# Patient Record
Sex: Female | Born: 1970 | Race: White | Hispanic: No | Marital: Married | Smoking: Current every day smoker
Health system: Southern US, Community
[De-identification: ages and names within clinical notes are randomized; demographics above are authoritative.]

## PROBLEM LIST (undated history)

## (undated) DIAGNOSIS — IMO0002 Reserved for concepts with insufficient information to code with codable children: Secondary | ICD-10-CM

## (undated) DIAGNOSIS — K219 Gastro-esophageal reflux disease without esophagitis: Secondary | ICD-10-CM

## (undated) DIAGNOSIS — M329 Systemic lupus erythematosus, unspecified: Secondary | ICD-10-CM

## (undated) DIAGNOSIS — R928 Other abnormal and inconclusive findings on diagnostic imaging of breast: Secondary | ICD-10-CM

## (undated) HISTORY — PX: TONSILLECTOMY: SUR1361

## (undated) HISTORY — DX: Reserved for concepts with insufficient information to code with codable children: IMO0002

## (undated) HISTORY — DX: Gastro-esophageal reflux disease without esophagitis: K21.9

## (undated) HISTORY — DX: Systemic lupus erythematosus, unspecified: M32.9

## (undated) HISTORY — PX: COLONOSCOPY: SHX174

## (undated) HISTORY — DX: Other abnormal and inconclusive findings on diagnostic imaging of breast: R92.8

---
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
## 2004-07-06 ENCOUNTER — Emergency Department: Payer: Self-pay | Admitting: Internal Medicine

## 2011-09-06 DIAGNOSIS — R928 Other abnormal and inconclusive findings on diagnostic imaging of breast: Secondary | ICD-10-CM

## 2011-09-06 HISTORY — DX: Other abnormal and inconclusive findings on diagnostic imaging of breast: R92.8

## 2011-09-06 HISTORY — PX: BREAST SURGERY: SHX581

## 2011-10-04 ENCOUNTER — Ambulatory Visit: Payer: Self-pay | Admitting: Family Medicine

## 2011-10-07 ENCOUNTER — Ambulatory Visit: Payer: Self-pay | Admitting: Family Medicine

## 2011-10-19 ENCOUNTER — Ambulatory Visit: Payer: Self-pay | Admitting: General Surgery

## 2012-05-06 HISTORY — PX: BREAST BIOPSY: SHX20

## 2012-05-09 ENCOUNTER — Ambulatory Visit: Payer: Self-pay | Admitting: General Surgery

## 2012-06-04 ENCOUNTER — Ambulatory Visit: Payer: Self-pay | Admitting: General Surgery

## 2012-10-08 DIAGNOSIS — B002 Herpesviral gingivostomatitis and pharyngotonsillitis: Secondary | ICD-10-CM | POA: Insufficient documentation

## 2012-10-08 DIAGNOSIS — K219 Gastro-esophageal reflux disease without esophagitis: Secondary | ICD-10-CM | POA: Insufficient documentation

## 2012-10-08 DIAGNOSIS — Z720 Tobacco use: Secondary | ICD-10-CM | POA: Insufficient documentation

## 2012-10-14 ENCOUNTER — Encounter: Payer: Self-pay | Admitting: *Deleted

## 2012-10-14 DIAGNOSIS — R928 Other abnormal and inconclusive findings on diagnostic imaging of breast: Secondary | ICD-10-CM | POA: Insufficient documentation

## 2012-11-28 ENCOUNTER — Ambulatory Visit: Payer: Self-pay | Admitting: General Surgery

## 2012-12-20 ENCOUNTER — Ambulatory Visit: Payer: Self-pay | Admitting: General Surgery

## 2012-12-24 ENCOUNTER — Encounter: Payer: Self-pay | Admitting: General Surgery

## 2012-12-24 ENCOUNTER — Ambulatory Visit (INDEPENDENT_AMBULATORY_CARE_PROVIDER_SITE_OTHER): Payer: 59 | Admitting: General Surgery

## 2012-12-24 VITALS — BP 118/84 | HR 78 | Resp 12 | Ht 62.0 in | Wt 224.0 lb

## 2012-12-24 DIAGNOSIS — N63 Unspecified lump in unspecified breast: Secondary | ICD-10-CM

## 2012-12-24 NOTE — Patient Instructions (Addendum)
Return as needed

## 2012-12-24 NOTE — Progress Notes (Signed)
Patient ID: Pamela Mccormick, female   DOB: May 11, 1971, 42 y.o.   MRN: [id number]  Chief Complaint  Patient presents with  . Follow-up    mammogram    HPI Pamela Mccormick is a 42 y.o. female here todday for a n follow up mammogram done at [facility] on 12/20/12 cat 2 . Patient states no new breast problems.Patieny had a left  breast core biopsy back September 2013.this showed flat epithelial atypia with microcalcifications, sclerosing adenosis and columnar cell changes. No evidence of in situ or invasive cancer. HPI  Past Medical History  Diagnosis Date  . Abnormal mammogram, unspecified 2013    LEFT BREAST  . Acid reflux     Past Surgical History  Procedure Laterality Date  . Tonsillectomy  AGE 5  . Colonoscopy  AGE 14  . Skull fx   1984    FRACTURE  . Breast surgery Left 2013    left breast core biopsy    Family History  Problem Relation Age of Onset  . Lymphoma Mother 73    Social History History  Substance Use Topics  . Smoking status: Current Every Day Smoker -- 1.00 packs/day for 20 years  . Smokeless tobacco: Never Used  . Alcohol Use: Yes    No Known Allergies  Current Outpatient Prescriptions  Medication Sig Dispense Refill  . ranitidine (ZANTAC) 150 MG tablet Take 150 mg by mouth 2 (two) times daily.       No current facility-administered medications for this visit.    Review of Systems Review of Systems  Constitutional: Negative.   Respiratory: Negative.   Cardiovascular: Negative.     Blood pressure 118/84, pulse 78, resp. rate 12, height 5\' 2"  (1.575 m), weight 224 lb (101.606 kg), last menstrual period 12/17/2012.  Physical Exam Physical Exam  Constitutional: She appears well-developed and well-nourished.  Neck: Normal range of motion. Neck supple.  Cardiovascular: Normal rate, regular rhythm and normal heart sounds.   Pulmonary/Chest: Effort normal and breath sounds normal. Right breast exhibits no inverted nipple, no mass, no nipple discharge, no skin  change and no tenderness. Left breast exhibits no inverted nipple, no mass, no nipple discharge, no skin change and no tenderness.  Lymphadenopathy:    She has no cervical adenopathy.    She has no axillary adenopathy.    Data Reviewed Mammograms dated December 20, 2012 were reviewed. The previously noted abnormality is resolved. BI-RAD-2.  Assessment    Benign breast exam.     Plan    The patient should resume annual bilateral mammograms in fall 2014 under the care of her primary care physician.        Earline Mayotte 12/25/2012, 8:39 PM  CC: Cay Schillings, M.D.

## 2012-12-25 ENCOUNTER — Encounter: Payer: Self-pay | Admitting: General Surgery

## 2014-07-07 ENCOUNTER — Encounter: Payer: Self-pay | Admitting: General Surgery

## 2015-06-20 ENCOUNTER — Emergency Department: Payer: 59

## 2015-06-20 ENCOUNTER — Observation Stay
Admission: EM | Admit: 2015-06-20 | Discharge: 2015-06-21 | Disposition: A | Discharge status: Home / Self Care | Payer: 59 | Attending: Internal Medicine | Admitting: Internal Medicine

## 2015-06-20 DIAGNOSIS — M25559 Pain in unspecified hip: Secondary | ICD-10-CM | POA: Diagnosis present

## 2015-06-20 DIAGNOSIS — Z8249 Family history of ischemic heart disease and other diseases of the circulatory system: Secondary | ICD-10-CM | POA: Diagnosis not present

## 2015-06-20 DIAGNOSIS — R102 Pelvic and perineal pain: Secondary | ICD-10-CM | POA: Insufficient documentation

## 2015-06-20 DIAGNOSIS — K5730 Diverticulosis of large intestine without perforation or abscess without bleeding: Secondary | ICD-10-CM | POA: Diagnosis not present

## 2015-06-20 DIAGNOSIS — Z833 Family history of diabetes mellitus: Secondary | ICD-10-CM | POA: Diagnosis not present

## 2015-06-20 DIAGNOSIS — K219 Gastro-esophageal reflux disease without esophagitis: Secondary | ICD-10-CM | POA: Insufficient documentation

## 2015-06-20 DIAGNOSIS — R928 Other abnormal and inconclusive findings on diagnostic imaging of breast: Secondary | ICD-10-CM | POA: Diagnosis not present

## 2015-06-20 DIAGNOSIS — S7002XA Contusion of left hip, initial encounter: Principal | ICD-10-CM | POA: Insufficient documentation

## 2015-06-20 DIAGNOSIS — W11XXXA Fall on and from ladder, initial encounter: Secondary | ICD-10-CM | POA: Insufficient documentation

## 2015-06-20 DIAGNOSIS — Y939 Activity, unspecified: Secondary | ICD-10-CM | POA: Diagnosis not present

## 2015-06-20 DIAGNOSIS — M62838 Other muscle spasm: Secondary | ICD-10-CM | POA: Diagnosis not present

## 2015-06-20 DIAGNOSIS — M160 Bilateral primary osteoarthritis of hip: Secondary | ICD-10-CM | POA: Diagnosis not present

## 2015-06-20 DIAGNOSIS — Y929 Unspecified place or not applicable: Secondary | ICD-10-CM | POA: Diagnosis not present

## 2015-06-20 DIAGNOSIS — Z807 Family history of other malignant neoplasms of lymphoid, hematopoietic and related tissues: Secondary | ICD-10-CM | POA: Insufficient documentation

## 2015-06-20 DIAGNOSIS — F17200 Nicotine dependence, unspecified, uncomplicated: Secondary | ICD-10-CM | POA: Insufficient documentation

## 2015-06-20 DIAGNOSIS — W19XXXA Unspecified fall, initial encounter: Secondary | ICD-10-CM

## 2015-06-20 DIAGNOSIS — M25552 Pain in left hip: Secondary | ICD-10-CM | POA: Diagnosis present

## 2015-06-20 LAB — CBC
HCT: 38.3 % (ref 35.0–47.0)
HEMOGLOBIN: 13.2 g/dL (ref 12.0–16.0)
MCH: 31.9 pg (ref 26.0–34.0)
MCHC: 34.5 g/dL (ref 32.0–36.0)
MCV: 92.4 fL (ref 80.0–100.0)
PLATELETS: 211 10*3/uL (ref 150–440)
RBC: 4.15 MIL/uL (ref 3.80–5.20)
RDW: 13.1 % (ref 11.5–14.5)
WBC: 10.5 10*3/uL (ref 3.6–11.0)

## 2015-06-20 LAB — CREATININE, SERUM
CREATININE: 0.84 mg/dL (ref 0.44–1.00)
GFR calc Af Amer: >60 mL/min (ref >60)

## 2015-06-20 MED ORDER — HYDROCODONE-ACETAMINOPHEN 5-325 MG PO TABS
1.0000 | ORAL_TABLET | ORAL | Status: DC | PRN
  Administered 2015-06-20 – 2015-06-21 (×2): 2 via ORAL
  Filled 2015-06-20: qty 2

## 2015-06-20 MED ORDER — CYCLOBENZAPRINE HCL 10 MG PO TABS
5.0000 mg | ORAL_TABLET | Freq: Three times a day (TID) | ORAL | Status: DC | PRN
  Administered 2015-06-20 – 2015-06-21 (×2): 5 mg via ORAL
  Filled 2015-06-20 (×2): qty 1

## 2015-06-20 MED ORDER — ONDANSETRON HCL 4 MG/2ML IJ SOLN
4.0000 mg | Freq: Once | INTRAMUSCULAR | Status: AC
  Administered 2015-06-20: 4 mg via INTRAVENOUS

## 2015-06-20 MED ORDER — ENOXAPARIN SODIUM 40 MG/0.4ML ~~LOC~~ SOLN
40.0000 mg | SUBCUTANEOUS | Status: DC
  Administered 2015-06-20: 40 mg via SUBCUTANEOUS
  Filled 2015-06-20: qty 0.4

## 2015-06-20 MED ORDER — HYDROMORPHONE HCL 1 MG/ML IJ SOLN
1.0000 mg | INTRAMUSCULAR | Status: DC | PRN
  Administered 2015-06-20: 1 mg via INTRAVENOUS

## 2015-06-20 MED ORDER — PANTOPRAZOLE SODIUM 40 MG PO TBEC
40.0000 mg | DELAYED_RELEASE_TABLET | Freq: Every day | ORAL | Status: DC
  Administered 2015-06-20 – 2015-06-21 (×2): 40 mg via ORAL
  Filled 2015-06-20 (×2): qty 1

## 2015-06-20 MED ORDER — HYDROMORPHONE HCL 1 MG/ML IJ SOLN
INTRAMUSCULAR | Status: AC
  Administered 2015-06-20: 1 mg via INTRAVENOUS
  Filled 2015-06-20: qty 1

## 2015-06-20 MED ORDER — ONDANSETRON HCL 4 MG/2ML IJ SOLN
INTRAMUSCULAR | Status: AC
  Administered 2015-06-20: 4 mg via INTRAVENOUS
  Filled 2015-06-20: qty 2

## 2015-06-20 MED ORDER — ACETAMINOPHEN 325 MG PO TABS: 650.0000 mg | ORAL_TABLET | Freq: Four times a day (QID) | ORAL | Status: DC | PRN

## 2015-06-20 MED ORDER — HYDROMORPHONE HCL 1 MG/ML IJ SOLN
1.0000 mg | Freq: Once | INTRAMUSCULAR | Status: AC
  Administered 2015-06-20: 1 mg via INTRAVENOUS

## 2015-06-20 MED ORDER — ACETAMINOPHEN 650 MG RE SUPP: 650.0000 mg | Freq: Four times a day (QID) | RECTAL | Status: DC | PRN

## 2015-06-20 MED ORDER — ONDANSETRON HCL 4 MG/2ML IJ SOLN: 4.0000 mg | Freq: Four times a day (QID) | INTRAMUSCULAR | Status: DC | PRN

## 2015-06-20 MED ORDER — ONDANSETRON HCL 4 MG PO TABS
4.0000 mg | ORAL_TABLET | Freq: Four times a day (QID) | ORAL | Status: DC | PRN
  Administered 2015-06-20 – 2015-06-21 (×2): 4 mg via ORAL
  Filled 2015-06-20 (×2): qty 1

## 2015-06-20 NOTE — H&P (Signed)
[facility] [place] [facility]   PATIENT NAME: Pamela Mccormick    MR#:  [id number]  DATE OF BIRTH:  01/10/1971  DATE OF ADMISSION:  06/20/2015  PRIMARY CARE PHYSICIAN: Andree Elk, MD   REQUESTING/REFERRING PHYSICIAN: Dr. Janalyn Harder  CHIEF COMPLAINT:   Chief Complaint  Patient presents with  . Fall  . Hip Pain    HISTORY OF PRESENT ILLNESS:  Pamela Mccormick  is a 44 y.o. female with a known history of GERD who presents to the hospital after suffering a mechanical fall off a ladder today. Patient was helping her husband and was on a ladder and then fell to the ground and hit a Financial planner for a fifth wheel. She had significant pain to her left hip and posterior back area. Patient could not bear any weight on her legs in a difficult time standing and therefore was brought via EMS to the emergency room. Patient underwent CT scan of her hip and also x-ray which showed no evidence of acute fracture. Patient received some IV fentanyl and IV Dilaudid for pain control but despite that she has significant pain in her left hip area and therefore hospitalist services were contacted for pain management.  PAST MEDICAL HISTORY:   Past Medical History  Diagnosis Date  . Abnormal mammogram, unspecified 2013    LEFT BREAST  . Acid reflux     PAST SURGICAL HISTORY:   Past Surgical History  Procedure Laterality Date  . Tonsillectomy  AGE 52  . Colonoscopy  AGE 48  . Skull fx   1984    FRACTURE  . Breast surgery Left 2013    left breast core biopsy    SOCIAL HISTORY:   Social History  Substance Use Topics  . Smoking status: Current Every Day Smoker -- 1.00 packs/day for 20 years  . Smokeless tobacco: Never Used  . Alcohol Use: Yes    FAMILY HISTORY:   Family History  Problem Relation Age of Onset  . Lymphoma Mother 35  . Diabetes Father   . CAD Father     DRUG ALLERGIES:  No Known Allergies  REVIEW OF SYSTEMS:   Review of Systems   Constitutional: Negative for fever and weight loss.  HENT: Negative for congestion, nosebleeds and tinnitus.   Eyes: Negative for blurred vision, double vision and redness.  Respiratory: Negative for cough, hemoptysis and shortness of breath.   Cardiovascular: Negative for chest pain, orthopnea, leg swelling and PND.  Gastrointestinal: Negative for nausea, vomiting, abdominal pain, diarrhea and melena.  Genitourinary: Negative for dysuria, urgency and hematuria.  Musculoskeletal: Positive for back pain (left posterior back), joint pain (left hip) and falls.  Neurological: Negative for dizziness, tingling, sensory change, focal weakness, seizures, weakness and headaches.  Endo/Heme/Allergies: Negative for polydipsia. Does not bruise/bleed easily.  Psychiatric/Behavioral: Negative for depression and memory loss. The patient is not nervous/anxious.     MEDICATIONS AT HOME:   Prior to Admission medications   Medication Sig Start Date End Date Taking? Authorizing Provider  omeprazole (PRILOSEC) 20 MG capsule Take 20 mg by mouth daily.   Yes Historical Provider, MD      VITAL SIGNS:  Blood pressure 126/56, pulse 78, temperature 98.1 F (36.7 C), temperature source Oral, resp. rate 18, height  (1.6 m), weight 95.255 kg (210 lb), last menstrual period 05/07/2015, SpO2 100 %.  PHYSICAL EXAMINATION:  Physical Exam  GENERAL:  44 y.o.-year-old patient lying in the bed with no acute distress.  EYES: Pupils equal, round, reactive to light and accommodation. No scleral icterus. Extraocular muscles intact.  HEENT: Head atraumatic, normocephalic. Oropharynx and nasopharynx clear. No oropharyngeal erythema, moist oral mucosa  NECK:  Supple, no jugular venous distention. No thyroid enlargement, no tenderness.  LUNGS: Normal breath sounds bilaterally, no wheezing, rales, rhonchi. No use of accessory muscles of respiration.  CARDIOVASCULAR: S1, S2 RRR. No murmurs, rubs, gallops, clicks.  ABDOMEN:  Soft, nontender, nondistended. Bowel sounds present. No organomegaly or mass.  EXTREMITIES: No pedal edema, cyanosis, or clubbing. + 2 pedal & radial pulses b/l.   NEUROLOGIC: Cranial nerves II through XII are intact. No focal Motor or sensory deficits appreciated b/l PSYCHIATRIC: The patient is alert and oriented x 3. Good affect.  SKIN: No obvious rash, lesion, or ulcer.   LABORATORY PANEL:   CBC No results for input(s): WBC, HGB, HCT, PLT in the last 168 hours. ------------------------------------------------------------------------------------------------------------------  Chemistries  No results for input(s): NA, K, CL, CO2, GLUCOSE, BUN, CREATININE, CALCIUM, MG, AST, ALT, ALKPHOS, BILITOT in the last 168 hours.  Invalid input(s): GFRCGP ------------------------------------------------------------------------------------------------------------------  Cardiac Enzymes No results for input(s): TROPONINI in the last 168 hours. ------------------------------------------------------------------------------------------------------------------  RADIOLOGY:  Dg Pelvis 1-2 Views  06/20/2015  CLINICAL DATA:  Fall from step ladder EXAM: PELVIS - 1-2 VIEW COMPARISON:  None. FINDINGS: Single frontal view of the pelvis submitted. No displaced fracture or subluxation. Mild superior acetabular spurring bilaterally. Bilateral hip joints are symmetrical in appearance. Pelvic phleboliths are noted. IMPRESSION: No acute fracture or subluxation. Mild degenerative changes with superior acetabular spurring bilaterally. Electronically Signed   By: Natasha Mead M.D.   On: 06/20/2015 17:55   Ct Pelvis Wo Contrast  06/20/2015  CLINICAL DATA:  Fall off ladder onto Financial planner. Severe left hip and pelvic pain. Initial encounter. EXAM: CT PELVIS WITHOUT CONTRAST TECHNIQUE: Multidetector CT imaging of the pelvis was performed following the standard protocol without intravenous contrast. COMPARISON:  None.  FINDINGS: No evidence of acute fracture or dislocation involving either hip. No evidence of pelvic fracture or pelvic joint diastases. Mild degenerative spurring of both hips seen without significant joint space narrowing. Probable small bone island seen in the right femoral head. No evidence pelvic soft tissue hematoma or free fluid. Sigmoid diverticulosis noted as well as small approximately 2 cm left adnexal cyst. IMPRESSION: No acute findings.  No evidence of pelvic or hip fracture. Electronically Signed   By: Myles Rosenthal M.D.   On: 06/20/2015 18:00     IMPRESSION AND PLAN:   44 year old female with past medical history of GERD who presented to the hospital after a fall from a ladder and noted to have significant left hip pain and posterior back pain.  #1 left hip/lower back pain-likely secondary to a contusion from her fall. -Patient has no findings of acute fracture in the left hip or pelvis area. -She is being admitted for pain control. I will start her on some IV Dilaudid as needed along with oral Percocet. -I will also start her on Flexeril for muscle spasms.   -We'll get orthopedic evaluation in the morning.  #2 GERD-continue Protonix.     All the records are reviewed and case discussed with ED provider. Management plans discussed with the patient, family and they are in agreement.  CODE STATUS: Full  TOTAL TIME TAKING CARE OF THIS PATIENT: 45 minutes.    Houston Siren M.D on 06/20/2015 at 8:07 PM  Between 7am to 6pm - Pager - [phone]  After 6pm go to  [email or web address] - password [email or web address] [email or web address]  [facility]  Office  [phone]  CC: Primary care physician; Andree ElkWOLF,JACK H, MD

## 2015-06-20 NOTE — ED Provider Notes (Signed)
[facility] Emergency Department Provider Note  ____________________________________________  Time seen: Approximately 5:20 PM  I have reviewed the triage vital signs and the nursing notes.   HISTORY  Chief Complaint Fall and Hip Pain    HPI Pamela Mccormick is a 44 y.o. female who presents to the emergency department via EMS after falling the second step of a step ladder onto a trailer hitch.She states that she tried to stand after the injury and felt a sharp sensation in her pelvis. She now states she is unable to bear weight on left extremity. All pain is located in the hip region and she denies any leg pain. She states that she is experiencing 10 out of 10 pain at rest and that any movement is extremely excruciating. The pain is a sharp sensation. It severe even at rest. The patient has received 150 g of fentanyl from EMS and still rates her pain as 10 out of 10. Denies any numbness or tingling down in her left extremity. She denies any back pain. She denies hitting her head or losing consciousness. She denies any nausea or vomiting.   Past Medical History  Diagnosis Date  . Abnormal mammogram, unspecified 2013    LEFT BREAST  . Acid reflux     Patient Active Problem List   Diagnosis Date Noted  . Hip pain 06/20/2015  . Abnormal mammogram, unspecified     Past Surgical History  Procedure Laterality Date  . Tonsillectomy  AGE 43  . Colonoscopy  AGE 70  . Skull fx   1984    FRACTURE  . Breast surgery Left 2013    left breast core biopsy    Current Outpatient Rx  Name  Route  Sig  Dispense  Refill  . omeprazole (PRILOSEC) 20 MG capsule   Oral   Take 20 mg by mouth daily.           Allergies Review of patient's allergies indicates no known allergies.  Family History  Problem Relation Age of Onset  . Lymphoma Mother 47  . Diabetes Father   . CAD Father     Social History Social History  Substance Use Topics  . Smoking  status: Current Every Day Smoker -- 1.00 packs/day for 20 years  . Smokeless tobacco: Never Used  . Alcohol Use: Yes    Review of Systems Constitutional: No fever/chills Eyes: No visual changes. ENT: No sore throat. Cardiovascular: Denies chest pain. Respiratory: Denies shortness of breath. Gastrointestinal: No abdominal pain.  No nausea, no vomiting.  No diarrhea.  No constipation. Genitourinary: Negative for dysuria. Musculoskeletal: Negative for back pain. Endorses severe left hip pain. Skin: Negative for rash. Neurological: Negative for headaches, focal weakness or numbness.  10-point ROS otherwise negative.  ____________________________________________   PHYSICAL EXAM:  VITAL SIGNS: ED Triage Vitals  Enc Vitals Group     BP 06/20/15 1710 125/69 mmHg     Pulse Rate 06/20/15 1710 76     Resp 06/20/15 1710 17     Temp 06/20/15 1710 98.1 F (36.7 C)     Temp Source 06/20/15 1710 Oral     SpO2 06/20/15 1710 98 %     Weight 06/20/15 1710 210 lb (95.255 kg)     Height 06/20/15 1710  (1.6 m)     Head Cir --      Peak Flow --      Pain Score 06/20/15 1715 10     Pain Loc --  Pain Edu? --      Excl. in GC? --     Constitutional: Alert and oriented. Well appearing and in no acute distress. Eyes: Conjunctivae are normal. PERRL. EOMI. Head: Atraumatic. Nose: No congestion/rhinnorhea. Mouth/Throat: Mucous membranes are moist.  Oropharynx non-erythematous. Neck: No stridor.  No cervical spine tenderness to palpation. Cardiovascular: Normal rate, regular rhythm. Grossly normal heart sounds.  Good peripheral circulation. Respiratory: Normal respiratory effort.  No retractions. Lungs CTAB. Gastrointestinal: Soft and nontender. No distention. No abdominal bruits. No CVA tenderness. Musculoskeletal: No lower extremity tenderness nor edema.  No joint effusions. No visible deformity noted to left hip or femur. Ecchymosis noted to lateral left hip. Patient is extremely  tender to palpation over the iliac crest. She is nontender to palpation over the upper portion of the femur. Range of motion elicits extreme pain patient's pulses are intact distally as well as sensation.. Left and pressure on the hip is extremely tender. Neurologic:  Normal speech and language. No gross focal neurologic deficits are appreciated. No gait instability. Skin:  Skin is warm, dry and intact. No rash noted. Psychiatric: Mood and affect are normal. Speech and behavior are normal.  ____________________________________________   LABS (all labs ordered are listed, but only abnormal results are displayed)  Labs Reviewed - No data to display ____________________________________________  EKG   ____________________________________________  RADIOLOGY  Two-view pelvis Impression: No acute fracture or subluxation. Mild degenerative changes with superior acetabular spurring bilaterally.  CT pelvis without contrast Impression: No acute findings. No evidence of pelvic or hip fracture. ____________________________________________   PROCEDURES  Procedure(s) performed: None  Critical Care performed: No  ____________________________________________   INITIAL IMPRESSION / ASSESSMENT AND PLAN / ED COURSE  Pertinent labs & imaging results that were available during my care of the patient were reviewed by me and considered in my medical decision making (see chart for details).  She's history, symptoms, physical exam were discussed with the patient. Patient was sent to x-ray and CT to evaluate for pelvic fracture. Radiologist read both exams as negative for bony abnormality. Discussed findings with Dr. Martha ClanKrasinski in orthopedics and Dr. Carollee MassedKaminski. The patient's pain was unabated after giving 150 g of fentanyl and 2 mg of Dilaudid. Assaulted with hospitalist for admission for pain control. They came and evaluated patient and the determination was made to admit the patient for pain control.  Patient was informed of diagnosis of hip contusion and advised that we would admit her. Patient was compliant with treatment plan. ____________________________________________   FINAL CLINICAL IMPRESSION(S) / ED DIAGNOSES  Final diagnoses:  Hip pain, left  Contusion, hip, left, initial encounter      Racheal PatchesJonathan D Cuthriell, PA-C 06/20/15 2050  Darien Ramusavid W Kaminski, MD 06/20/15 2308

## 2015-06-20 NOTE — ED Notes (Signed)
Patient fell from stepladder. Larey SeatFell backwards and landed on a Financial plannertrailer hitch for a 5th wheel. Pain to left hip/flank area. Can move leg but very painful. Denies any other pain at this time.

## 2015-06-21 LAB — CBC
HEMATOCRIT: 36.9 % (ref 35.0–47.0)
Hemoglobin: 12.9 g/dL (ref 12.0–16.0)
MCH: 32.3 pg (ref 26.0–34.0)
MCHC: 34.9 g/dL (ref 32.0–36.0)
MCV: 92.6 fL (ref 80.0–100.0)
Platelets: 202 10*3/uL (ref 150–440)
RBC: 3.98 MIL/uL (ref 3.80–5.20)
RDW: 13.1 % (ref 11.5–14.5)
WBC: 8.7 10*3/uL (ref 3.6–11.0)

## 2015-06-21 LAB — BASIC METABOLIC PANEL
Anion gap: 4 — ABNORMAL LOW (ref 5–15)
BUN: 14 mg/dL (ref 6–20)
CO2: 26 mmol/L (ref 22–32)
Calcium: 8.6 mg/dL — ABNORMAL LOW (ref 8.9–10.3)
Chloride: 109 mmol/L (ref 101–111)
Creatinine, Ser: 0.78 mg/dL (ref 0.44–1.00)
GFR calc Af Amer: >60 mL/min (ref >60)
GLUCOSE: 109 mg/dL — AB (ref 65–99)
POTASSIUM: 3.7 mmol/L (ref 3.5–5.1)
Sodium: 139 mmol/L (ref 135–145)

## 2015-06-21 MED ORDER — CYCLOBENZAPRINE HCL 5 MG PO TABS: 5.0000 mg | ORAL_TABLET | Freq: Three times a day (TID) | ORAL | Status: DC | PRN

## 2015-06-21 MED ORDER — HYDROCODONE-ACETAMINOPHEN 5-325 MG PO TABS: 1.0000 | ORAL_TABLET | ORAL | Status: DC | PRN

## 2015-06-21 MED ORDER — CYCLOBENZAPRINE HCL 10 MG PO TABS
5.0000 mg | ORAL_TABLET | Freq: Three times a day (TID) | ORAL | Status: DC | PRN
  Administered 2015-06-21: 5 mg via ORAL
  Filled 2015-06-21: qty 1

## 2015-06-21 NOTE — Progress Notes (Signed)
Muscle spasms

## 2015-06-21 NOTE — Progress Notes (Signed)
lovenox                 

## 2015-06-21 NOTE — Progress Notes (Signed)
Sore painful to walk from fall

## 2015-06-21 NOTE — Evaluation (Signed)
Physical Therapy Evaluation Patient Details Name: Pamela Mccormick MRN: [id number] DOB: Dec 19, 1970 Today's Date: 06/21/2015   History of Present Illness  Pt was helping cut down a tree, fell into a 5th wheel injuring her L hip, (-) for fracture  Clinical Impression  Pt having acute pain, but able to safely ambulate and negotiate steps w/o AD.  She is able to tolerate SLS on L and though she has some limp she does not have any significant limitations.     Follow Up Recommendations No PT follow up    Equipment Recommendations       Recommendations for Other Services       Precautions / Restrictions Restrictions Weight Bearing Restrictions: No      Mobility  Bed Mobility Overal bed mobility: Independent             General bed mobility comments: Pt able to get to sitting w/ only minimal UE use  Transfers Overall transfer level: Independent Equipment used: None             General transfer comment: Pt rises to standing with no issue, able to do SLS on L with light UE use and no severe increased pain  Ambulation/Gait Ambulation/Gait assistance: Independent Ambulation Distance (Feet): 100 Feet Assistive device: None       General Gait Details: Pt has mild limp with ambulation, but generally was able to walk safely.  She did well without AD and did not describe increased pain.  Stairs Stairs: Yes Stairs assistance: Supervision Stair Management: One rail Right Number of Stairs: 4 General stair comments: step-to strategy with no signficant hesitation  Wheelchair Mobility    Modified Rankin (Stroke Patients Only)       Balance                                             Pertinent Vitals/Pain Pain Assessment: 0-10 Pain Score: 3  (increased signficantly with activity)    Home Living Family/patient expects to be discharged to:: Private residence Living Arrangements: Spouse/significant other   Type of Home: House Home  Access: Stairs to enter Entrance Stairs-Rails: Can reach both Entrance Stairs-Number of Steps: 5          Prior Function Level of Independence: Independent         Comments: Pt works full time (at a desk job) and is normally able to do all she needs     Hand Dominance        Extremity/Trunk Assessment   Upper Extremity Assessment: Overall WFL for tasks assessed           Lower Extremity Assessment: Overall WFL for tasks assessed (she does have pain with L hip movement and bracing)         Communication   Communication: No difficulties  Cognition Arousal/Alertness: Awake/alert Behavior During Therapy: WFL for tasks assessed/performed Overall Cognitive Status: Within Functional Limits for tasks assessed                      General Comments      Exercises        Assessment/Plan    PT Assessment Patent does not need any further PT services  PT Diagnosis Difficulty walking;Acute pain   PT Problem List    PT Treatment Interventions     PT Goals (Current goals can be found  in the Care Plan section) Acute Rehab PT Goals Patient Stated Goal: "go home"    Frequency     Barriers to discharge        Co-evaluation               End of Session   Activity Tolerance: Patient tolerated treatment well;Patient limited by pain Patient left: in bed Nurse Communication: Mobility status    Functional Assessment Tool Used: clinical judgement Functional Limitation: Mobility: Walking and moving around Mobility: Walking and Moving Around Current Status (Z6109): At least 1 percent but less than 20 percent impaired, limited or restricted Mobility: Walking and Moving Around Goal Status [phone]): At least 1 percent but less than 20 percent impaired, limited or restricted Mobility: Walking and Moving Around Discharge Status [phone]): At least 1 percent but less than 20 percent impaired, limited or restricted    Time: 0840-0852 PT Time Calculation (min)  (ACUTE ONLY): 12 min   Charges:   PT Evaluation $Initial PT Evaluation Tier I: 1 Procedure     PT G Codes:   PT G-Codes **NOT FOR INPATIENT CLASS** Functional Assessment Tool Used: clinical judgement Functional Limitation: Mobility: Walking and moving around Mobility: Walking and Moving Around Current Status (B1478): At least 1 percent but less than 20 percent impaired, limited or restricted Mobility: Walking and Moving Around Goal Status [phone]): At least 1 percent but less than 20 percent impaired, limited or restricted Mobility: Walking and Moving Around Discharge Status [phone]): At least 1 percent but less than 20 percent impaired, limited or restricted   Loran Senters, PT, DPT [phone]  Malachi Pro 06/21/2015, 10:11 AM

## 2015-06-21 NOTE — Discharge Summary (Signed)
[facility] [place] at [facility]   PATIENT NAME: Pamela Mccormick    MR#:  [id number]  DATE OF BIRTH:  02/07/1971  DATE OF ADMISSION:  06/20/2015 ADMITTING PHYSICIAN: Houston Siren, MD  DATE OF DISCHARGE: 06/21/2015 10:41 AM  PRIMARY CARE PHYSICIAN: Andree Elk, MD    ADMISSION DIAGNOSIS:  Fall [W19.XXXA] Hip pain, left [M25.552] Contusion, hip, left, initial encounter [S70.02XA]  DISCHARGE DIAGNOSIS:  Active Problems:   Hip pain   SECONDARY DIAGNOSIS:   Past Medical History  Diagnosis Date  . Abnormal mammogram, unspecified 2013    LEFT BREAST  . Acid reflux     HOSPITAL COURSE:  44 year old female who presented after mechanical fall with hip pain. Further details please for H&P.  1. Hip pain: This is secondary to fall and bone contusion with muscle spasms. Patient was seen and evaluated by orthopedic surgery as well as physical therapy. Patient will continue with when necessary pain medications per patient has no hip fracture as per CT scan.   2. GERD: Patient can continue omeprazole. DISCHARGE CONDITIONS AND DIET:  Patient's being discharged home in stable condition on a regular diet  CONSULTS OBTAINED:  Treatment Team:  Juanell Fairly, MD  DRUG ALLERGIES:  No Known Allergies  DISCHARGE MEDICATIONS:   Discharge Medication List as of 06/21/2015  9:20 AM    START taking these medications   Details  cyclobenzaprine (FLEXERIL) 5 MG tablet Take 1 tablet (5 mg total) by mouth 3 (three) times daily as needed for muscle spasms., Starting 06/21/2015, Until Discontinued, Normal    HYDROcodone-acetaminophen (NORCO/VICODIN) 5-325 MG tablet Take 1 tablet by mouth every 4 (four) hours as needed for moderate pain., Starting 06/21/2015, Until Discontinued, Print      CONTINUE these medications which have NOT CHANGED   Details  omeprazole (PRILOSEC) 20 MG capsule Take 20 mg by mouth daily., Until Discontinued, Historical Med               Today   CHIEF COMPLAINT:  Patient is doing well this morning. She continues to have some pain but is able to move. She denies saddle anesthesia or bowel or bladder incontinence.   VITAL SIGNS:  Blood pressure 103/59, pulse 63, temperature 98.2 F (36.8 C), temperature source Oral, resp. rate 18, height  (1.6 m), weight 94.666 kg (208 lb 11.2 oz), last menstrual period 05/07/2015, SpO2 95 %.   REVIEW OF SYSTEMS:  Review of Systems  Constitutional: Negative for fever, chills and malaise/fatigue.  HENT: Negative for sore throat.   Eyes: Negative for blurred vision.  Respiratory: Negative for cough, hemoptysis, shortness of breath and wheezing.   Cardiovascular: Negative for chest pain, palpitations and leg swelling.  Gastrointestinal: Negative for nausea, vomiting, abdominal pain, diarrhea and blood in stool.  Genitourinary: Negative for dysuria.  Musculoskeletal: Positive for falls. Negative for back pain.  Neurological: Negative for dizziness, tremors and headaches.  Endo/Heme/Allergies: Does not bruise/bleed easily.     PHYSICAL EXAMINATION:  GENERAL:  44 y.o.-year-old patient lying in the bed with no acute distress.  NECK:  Supple, no jugular venous distention. No thyroid enlargement, no tenderness.  LUNGS: Normal breath sounds bilaterally, no wheezing, rales,rhonchi  No use of accessory muscles of respiration.  CARDIOVASCULAR: S1, S2 normal. No murmurs, rubs, or gallops.  ABDOMEN: Soft, non-tender, non-distended. Bowel sounds present. No organomegaly or mass.  EXTREMITIES: No pedal edema, cyanosis, or clubbing. She has muscle spasms left gluteal muscle PSYCHIATRIC: The patient is alert and oriented  x 3.  SKIN: No obvious rash, lesion, or ulcer.   DATA REVIEW:   CBC  Recent Labs Lab 06/21/15 0345  WBC 8.7  HGB 12.9  HCT 36.9  PLT 202    Chemistries   Recent Labs Lab 06/21/15 0345  NA 139  K 3.7  CL 109  CO2 26  GLUCOSE 109*  BUN 14   CREATININE 0.78  CALCIUM 8.6*    Cardiac Enzymes No results for input(s): TROPONINI in the last 168 hours.  Microbiology Results  @MICRORSLT48 @  RADIOLOGY:  Dg Pelvis 1-2 Views  06/20/2015  CLINICAL DATA:  Fall from step ladder EXAM: PELVIS - 1-2 VIEW COMPARISON:  None. FINDINGS: Single frontal view of the pelvis submitted. No displaced fracture or subluxation. Mild superior acetabular spurring bilaterally. Bilateral hip joints are symmetrical in appearance. Pelvic phleboliths are noted. IMPRESSION: No acute fracture or subluxation. Mild degenerative changes with superior acetabular spurring bilaterally. Electronically Signed   By: Natasha MeadLiviu  Pop M.D.   On: 06/20/2015 17:55   Ct Pelvis Wo Contrast  06/20/2015  CLINICAL DATA:  Fall off ladder onto [employer]iler hitch. Severe left hip and pelvic pain. Initial encounter. EXAM: CT PELVIS WITHOUT CONTRAST TECHNIQUE: Multidetector CT imaging of the pelvis was performed following the standard protocol without intravenous contrast. COMPARISON:  None. FINDINGS: No evidence of acute fracture or dislocation involving either hip. No evidence of pelvic fracture or pelvic joint diastases. Mild degenerative spurring of both hips seen without significant joint space narrowing. Probable small bone island seen in the right femoral head. No evidence pelvic soft tissue hematoma or free fluid. Sigmoid diverticulosis noted as well as small approximately 2 cm left adnexal cyst. IMPRESSION: No acute findings.  No evidence of pelvic or hip fracture. Electronically Signed   By: Myles RosenthalJohn  Stahl M.D.   On: 06/20/2015 18:00      Management plans discussed with the patient and she is in agreement. Stable for discharge home  Patient should follow up with PCP in one week  CODE STATUS:     Code Status Orders        Start     Ordered   06/20/15 2057  Full code   Continuous     06/20/15 2057      TOTAL TIME TAKING CARE OF THIS PATIENT: 35 minutes.    Khyleigh Furney M.D on  06/21/2015 at 11:09 AM  Between 7am to 6pm - Pager - [phone] After 6pm go to [email or web address] - password [email or web address] [email or web address]  [facility]  Office  [phone]  CC: Primary care physician; Andree ElkWOLF,JACK H, MD

## 2015-06-21 NOTE — Progress Notes (Signed)
Patient fell in yard cutting limbs from tree while standing on a ladder. Fell onto trailer /truck bed onto spare trailer tire hitting left pelvis . No fracture per xray.pain med , antiemetic and muscle rexer po. Patient and husband sleeping well this shift. Patient up with assist to bedside commode to void. To see ortho for consult today.

## 2016-09-13 DIAGNOSIS — R509 Fever, unspecified: Secondary | ICD-10-CM | POA: Diagnosis not present

## 2016-09-13 DIAGNOSIS — R52 Pain, unspecified: Secondary | ICD-10-CM | POA: Diagnosis not present

## 2017-03-21 ENCOUNTER — Ambulatory Visit (INDEPENDENT_AMBULATORY_CARE_PROVIDER_SITE_OTHER): Payer: Worker's Compensation | Admitting: Podiatry

## 2017-03-21 ENCOUNTER — Encounter: Payer: Self-pay | Admitting: Podiatry

## 2017-03-21 ENCOUNTER — Ambulatory Visit (INDEPENDENT_AMBULATORY_CARE_PROVIDER_SITE_OTHER): Payer: Worker's Compensation

## 2017-03-21 DIAGNOSIS — S93601A Unspecified sprain of right foot, initial encounter: Secondary | ICD-10-CM

## 2017-03-21 DIAGNOSIS — S92414A Nondisplaced fracture of proximal phalanx of right great toe, initial encounter for closed fracture: Secondary | ICD-10-CM

## 2017-03-21 DIAGNOSIS — M7751 Other enthesopathy of right foot: Secondary | ICD-10-CM | POA: Diagnosis not present

## 2017-03-21 MED ORDER — MELOXICAM 15 MG PO TABS: 15.0000 mg | ORAL_TABLET | Freq: Every day | ORAL | 1 refills | Status: AC

## 2017-03-21 NOTE — Progress Notes (Signed)
   HPI: 46 year old otherwise healthy female presents today for evaluation of right great toe pain. Patient states that approximate 4 weeks ago she stubbed her foot while at work on a door frame. Patient states that the baseboard of the doorframe is elevated approximately 2 inches. She hit her foot on that baseboard of the doorframe and noticed significant pain and tenderness. Patient went to an urgent care facility and was diagnosed with only soft tissue injury, however the injury is not improved in the patient presents today for second opinion and further treatment and evaluation.   Physical Exam: General: The patient is alert and oriented x3 in no acute distress.  Dermatology: Skin is warm, dry and supple bilateral lower extremities. Negative for open lesions or macerations.  Vascular: Palpable pedal pulses bilaterally. Moderate erythema and edema noted localized to the first MTPJ right foot. Capillary refill within normal limits.  Neurological: Epicritic and protective threshold grossly intact bilaterally.   Musculoskeletal Exam: Significant pain on palpation with dorsiflexion of the first MTPJ right foot. Range of motion within normal limits to all pedal and ankle joints bilateral. Muscle strength 5/5 in all groups bilateral.   Radiographic Exam:  There are 2 subtle cortical irregularities on radiographic exam medial oblique view. There is cortical irregularity and subtle fracture of the head of the proximal phalanx, nondisplaced, intra-articular at the IPJ. His also cortical regularity suspicious for fracture to the plantar aspect of the head of the first metatarsal very close to the tibial sesamoid best seen on medial oblique view. Nondisplaced, intra-articular.  Assessment: 1. Multiple fractures localized around the first ray right foot 2. Edema with erythema localized to the first MTPJ right foot   Plan of Care:  1. Patient was evaluated. X-rays were reviewed today in detail 2.  Today were that proceed with conservative fracture management. Postoperative shoe dispensed today. Patient needs to wear the postop shoe 4 weeks. 3. Injection of 0.5 mL Celestone Soluspan injected in the first MTPJ right foot 4. Return to clinic in 4 weeks for follow-up x-rays   Felecia ShellingBrent M. Hines Kloss, DPM [facility]  Dr. Felecia ShellingBrent M. Arielys Wandersee, DPM    [address]                                        [place], [place] [address]                Office [phone]  Fax [phone]

## 2017-03-27 ENCOUNTER — Telehealth: Payer: Self-pay | Admitting: Podiatry

## 2017-03-27 NOTE — Telephone Encounter (Signed)
This is Rinaldo Cloudamela calling from [employer] for a workers' compensation case. We need the office visit note from 21 March 2017 faxed to us at [phone]. Thank you.

## 2017-04-18 ENCOUNTER — Ambulatory Visit (INDEPENDENT_AMBULATORY_CARE_PROVIDER_SITE_OTHER): Payer: Worker's Compensation

## 2017-04-18 ENCOUNTER — Ambulatory Visit (INDEPENDENT_AMBULATORY_CARE_PROVIDER_SITE_OTHER): Payer: Worker's Compensation | Admitting: Podiatry

## 2017-04-18 DIAGNOSIS — S92414D Nondisplaced fracture of proximal phalanx of right great toe, subsequent encounter for fracture with routine healing: Secondary | ICD-10-CM

## 2017-04-18 DIAGNOSIS — S93601D Unspecified sprain of right foot, subsequent encounter: Secondary | ICD-10-CM | POA: Diagnosis not present

## 2017-04-18 DIAGNOSIS — S92414A Nondisplaced fracture of proximal phalanx of right great toe, initial encounter for closed fracture: Secondary | ICD-10-CM | POA: Diagnosis not present

## 2017-04-24 NOTE — Progress Notes (Signed)
   HPI: 46 year old otherwise healthy female presents today for follow-up evaluation of multiple nondisplaced located fractures localized from the first ray of the right foot.. Patient states that approximate 8 weeks ago she stubbed her foot while at work on a door frame. Patient states that the baseboard of the doorframe is elevated approximately 2 inches. She hit her foot on that baseboard of the doorframe and noticed significant pain and tenderness. Patient states that she's doing much better in the postoperative shoe. She still has not on the top of her first metatarsal and she states that she still has some pain which comes and goes. Overall the patient states that she is improved.   Physical Exam: General: The patient is alert and oriented x3 in no acute distress.  Dermatology: Skin is warm, dry and supple bilateral lower extremities. Negative for open lesions or macerations.  Vascular: Palpable pedal pulses bilaterally. Moderate erythema and edema noted localized to the first MTPJ right foot. Capillary refill within normal limits.  Neurological: Epicritic and protective threshold grossly intact bilaterally.   Musculoskeletal Exam: Negative for significant pain on palpation with dorsiflexion of the first MTPJ right foot. Range of motion within normal limits to all pedal and ankle joints bilateral. Muscle strength 5/5 in all groups bilateral.   Radiographic Exam:  Normal osseous mineralization noted. Fracture fragments appeared to be somewhat healed. Normal anatomical alignment noted. Joint spaces preserved.  Assessment: 1. Multiple nondisplaced fractures localized around the first ray right foot-resolved, with routine healing  Plan of Care:  1. Patient was evaluated. X-rays were reviewed today in detail 2. Today the patient continue discontinue the postoperative shoe. Resume normal shoe gear. Recommend the pulp patient slowly increase activity over transitional period of the next 2-4  weeks #3 return to clinic when necessary  Felecia Shelling, DPM [facility]  Dr. Felecia Shelling, DPM    [address] [place], [place] [address]                Office [phone]  Fax [phone]

## 2017-05-01 ENCOUNTER — Ambulatory Visit (INDEPENDENT_AMBULATORY_CARE_PROVIDER_SITE_OTHER): Payer: BLUE CROSS/BLUE SHIELD | Admitting: Obstetrics & Gynecology

## 2017-05-01 ENCOUNTER — Encounter: Payer: Self-pay | Admitting: Obstetrics & Gynecology

## 2017-05-01 VITALS — BP 132/84 | HR 85 | Ht 63.0 in | Wt 217.0 lb

## 2017-05-01 DIAGNOSIS — Z0000 Encounter for general adult medical examination without abnormal findings: Secondary | ICD-10-CM

## 2017-05-01 DIAGNOSIS — Z01419 Encounter for gynecological examination (general) (routine) without abnormal findings: Secondary | ICD-10-CM | POA: Diagnosis not present

## 2017-05-01 DIAGNOSIS — Z1231 Encounter for screening mammogram for malignant neoplasm of breast: Secondary | ICD-10-CM

## 2017-05-01 DIAGNOSIS — Z1239 Encounter for other screening for malignant neoplasm of breast: Secondary | ICD-10-CM

## 2017-05-01 DIAGNOSIS — Z780 Asymptomatic menopausal state: Secondary | ICD-10-CM

## 2017-05-01 MED ORDER — NORETHINDRONE 0.35 MG PO TABS: 1.0000 | ORAL_TABLET | Freq: Every day | ORAL | 11 refills | Status: DC

## 2017-05-01 NOTE — Progress Notes (Signed)
HPI:      [name] is a 46 y.o. G1P1 who LMP was Patient's last menstrual period was 03/15/2017.,she presents today for her annual examination. The patient has no complaints today. The patient is sexually active. Her last pap: approximate date 2016 and was normal and last mammogram: approximate date 2016 and was normal. The patient does perform self breast exams.  There is no notable family history of breast or ovarian cancer in her family.  The patient has regular exercise: yes.  The patient denies current symptoms of depression.    GYN History: Contraception: none  PMHx: Past Medical History:  Diagnosis Date  . Abnormal mammogram, unspecified 2013   LEFT BREAST  . Acid reflux    Past Surgical History:  Procedure Laterality Date  . BREAST SURGERY Left 2013   left breast core biopsy  . COLONOSCOPY  AGE 73  . skull fx   1984   FRACTURE  . TONSILLECTOMY  AGE 27   Family History  Problem Relation Age of Onset  . Lymphoma Mother 77  . Hypertension Mother   . Diabetes Father   . CAD Father   . Hypertension Father   . Hypertension Sister   . Hypertension Brother   . Breast cancer Neg Hx   . Ovarian cancer Neg Hx    Social History  Substance Use Topics  . Smoking status: Current Every Day Smoker    Packs/day: 1.00    Years: 20.00  . Smokeless tobacco: Never Used  . Alcohol use Yes    Current Outpatient Prescriptions:  .  acyclovir (ZOVIRAX) 400 MG tablet, take 1 tablet by mouth three times a day AS NEEDED FOR ONE WEEK FOR COLD SORES, Disp: , Rfl:  .  fluticasone (FLONASE) 50 MCG/ACT nasal spray, Place into the nose., Disp: , Rfl:  .  omeprazole (PRILOSEC) 20 MG capsule, Take 20 mg by mouth daily., Disp: , Rfl:  .  acyclovir (ZOVIRAX) 400 MG tablet, , Disp: , Rfl: 0 .  cetirizine (ZYRTEC) 10 MG tablet, Take by mouth., Disp: , Rfl:  .  cyclobenzaprine (FLEXERIL) 5 MG tablet, Take 1 tablet (5 mg total) by mouth 3 (three) times daily as needed for muscle  spasms. (Patient not taking: Reported on 03/21/2017), Disp: 30 tablet, Rfl: 0 .  HYDROcodone-acetaminophen (NORCO/VICODIN) 5-325 MG tablet, Take 1 tablet by mouth every 4 (four) hours as needed for moderate pain. (Patient not taking: Reported on 03/21/2017), Disp: 20 tablet, Rfl: 0 .  naproxen (NAPROSYN) 500 MG tablet, Take by mouth., Disp: , Rfl:  .  norethindrone (MICRONOR,CAMILA,ERRIN) 0.35 MG tablet, Take 1 tablet (0.35 mg total) by mouth daily., Disp: 1 Package, Rfl: 11 Allergies: Amoxicillin  Review of Systems  Constitutional: Negative for chills, fever and malaise/fatigue.  HENT: Negative for congestion, sinus pain and sore throat.   Eyes: Negative for blurred vision and pain.  Respiratory: Negative for cough and wheezing.   Cardiovascular: Negative for chest pain and leg swelling.  Gastrointestinal: Negative for abdominal pain, constipation, diarrhea, heartburn, nausea and vomiting.  Genitourinary: Negative for dysuria, frequency, hematuria and urgency.  Musculoskeletal: Negative for back pain, joint pain, myalgias and neck pain.  Skin: Negative for itching and rash.  Neurological: Negative for dizziness, tremors and weakness.  Endo/Heme/Allergies: Does not bruise/bleed easily.  Psychiatric/Behavioral: Negative for depression. The patient is not nervous/anxious and does not have insomnia.     Objective: BP 132/84   Pulse 85   Ht 5\' 3"  (1.6 m)  Wt 217 lb (98.4 kg)   LMP 03/15/2017   BMI 38.44 kg/m   Filed Weights   05/01/17 0810  Weight: 217 lb (98.4 kg)   Body mass index is 38.44 kg/m. Physical Exam  Constitutional: She is oriented to person, place, and time. She appears well-developed and well-nourished. No distress.  Genitourinary: Rectum normal, vagina normal and uterus normal. Pelvic exam was performed with patient supine. There is no rash or lesion on the right labia. There is no rash or lesion on the left labia. Vagina exhibits no lesion. No bleeding in the vagina.  Right adnexum does not display mass and does not display tenderness. Left adnexum does not display mass and does not display tenderness. Cervix does not exhibit motion tenderness, lesion, friability or polyp.   Uterus is mobile and midaxial. Uterus is not enlarged or exhibiting a mass.  Genitourinary Comments: Small amount of white vag d/c  HENT:  Head: Normocephalic and atraumatic. Head is without laceration.  Right Ear: Hearing normal.  Left Ear: Hearing normal.  Nose: No epistaxis.  No foreign bodies.  Mouth/Throat: Uvula is midline, oropharynx is clear and moist and mucous membranes are normal.  Eyes: Pupils are equal, round, and reactive to light.  Neck: Normal range of motion. Neck supple. No thyromegaly present.  Cardiovascular: Normal rate and regular rhythm.  Exam reveals no gallop and no friction rub.   No murmur heard. Pulmonary/Chest: Effort normal and breath sounds normal. No respiratory distress. She has no wheezes. Right breast exhibits no mass, no skin change and no tenderness. Left breast exhibits no mass, no skin change and no tenderness.  Abdominal: Soft. Bowel sounds are normal. She exhibits no distension. There is no tenderness. There is no rebound.  Musculoskeletal: Normal range of motion.  Neurological: She is alert and oriented to person, place, and time. No cranial nerve deficit.  Skin: Skin is warm and dry.  Psychiatric: She has a normal mood and affect. Judgment normal.  Vitals reviewed.   Assessment:  ANNUAL EXAM 1. Annual physical exam   2. Screening for breast cancer   3. Menopause    Screening Plan:            1.  Cervical Screening-  Pap smear schedule reviewed with patient, due 2019  2. Breast screening- Exam annually and mammogram>40 planned   3. Colonoscopy every 10 years, Hemoccult testing - after age 29  4. Labs managed by PCP  5. Counseling for contraception: no method  Other:  1. Annual physical exam  2. Screening for breast cancer -  MM DIGITAL SCREENING BILATERAL; Future  3. Menopause - Plan Norethinedrone daily for help w transition.  No estrogen due to her smoking status.  4. Vag d/c esp when swimming and in water, tx for yeast and it helps, some d/c today so NuSwab to test for chronic carrier status     F/U  Return in about 2 months (around 07/01/2017) for Follow up.  Annamarie Major, MD, [facility], [facility] 05/01/2017  8:43 AM

## 2017-05-01 NOTE — Patient Instructions (Addendum)
PAP every three years Mammogram every year    Call [phone] to schedule at [place]/ [place] Colonoscopy every 10 years starting age 48 Labs yearly (with PCP)

## 2017-05-04 LAB — NUSWAB VAGINITIS PLUS (VG+)
CANDIDA ALBICANS, NAA: NEGATIVE
CANDIDA GLABRATA, NAA: NEGATIVE
Chlamydia trachomatis, NAA: NEGATIVE
Megasphaera 1: HIGH Score — AB
Neisseria gonorrhoeae, NAA: NEGATIVE
TRICH VAG BY NAA: NEGATIVE

## 2017-05-04 NOTE — Progress Notes (Signed)
NA... Will try again later.

## 2017-05-05 ENCOUNTER — Telehealth: Payer: Self-pay

## 2017-05-05 NOTE — Telephone Encounter (Signed)
Looks positive for BV?

## 2017-05-05 NOTE — Telephone Encounter (Signed)
Pt calling for lab test results from Monday.  [phone]

## 2017-05-08 MED ORDER — METRONIDAZOLE 500 MG PO TABS: 500.0000 mg | ORAL_TABLET | Freq: Two times a day (BID) | ORAL | 0 refills | Status: AC

## 2017-05-08 NOTE — Progress Notes (Signed)
Some sx's of BV and confirmed (indeterminate but no other positives) by NuSwab testing.  Tx w Flagyl, f/u if sx's recur.  D/w pt.

## 2017-05-17 ENCOUNTER — Ambulatory Visit
Admission: RE | Admit: 2017-05-17 | Discharge: 2017-05-17 | Disposition: A | Discharge status: Home / Self Care | Payer: BLUE CROSS/BLUE SHIELD | Source: Ambulatory Visit | Attending: Obstetrics & Gynecology | Admitting: Obstetrics & Gynecology

## 2017-05-17 DIAGNOSIS — Z1239 Encounter for other screening for malignant neoplasm of breast: Secondary | ICD-10-CM

## 2017-05-17 DIAGNOSIS — Z1231 Encounter for screening mammogram for malignant neoplasm of breast: Secondary | ICD-10-CM | POA: Diagnosis present

## 2017-05-23 ENCOUNTER — Inpatient Hospital Stay
Admission: RE | Admit: 2017-05-23 | Discharge: 2017-05-23 | Disposition: A | Discharge status: Home / Self Care | Payer: Self-pay | Source: Ambulatory Visit | Attending: *Deleted | Admitting: *Deleted

## 2017-05-23 ENCOUNTER — Encounter: Payer: Self-pay | Admitting: Obstetrics & Gynecology

## 2017-05-23 ENCOUNTER — Other Ambulatory Visit: Payer: Self-pay | Admitting: *Deleted

## 2017-05-23 DIAGNOSIS — Z9289 Personal history of other medical treatment: Secondary | ICD-10-CM

## 2017-06-26 ENCOUNTER — Telehealth: Payer: Self-pay | Admitting: Obstetrics & Gynecology

## 2017-06-26 NOTE — Telephone Encounter (Signed)
-----   Message from Nadara Mustardobert P Harris, MD sent at 06/23/2017  7:29 AM EDT ----- Regarding: appt She is sch for Nov 8 but I am in hospital this day so plz re-schedule

## 2017-06-26 NOTE — Telephone Encounter (Signed)
[facility]

## 2017-07-13 ENCOUNTER — Ambulatory Visit: Payer: Self-pay | Admitting: Obstetrics & Gynecology

## 2017-07-14 ENCOUNTER — Encounter: Payer: Self-pay | Admitting: Obstetrics & Gynecology

## 2017-07-14 ENCOUNTER — Ambulatory Visit: Payer: BLUE CROSS/BLUE SHIELD | Admitting: Obstetrics & Gynecology

## 2017-07-14 VITALS — BP 120/80 | Ht 63.0 in | Wt 223.0 lb

## 2017-07-14 DIAGNOSIS — N924 Excessive bleeding in the premenopausal period: Secondary | ICD-10-CM | POA: Insufficient documentation

## 2017-07-14 DIAGNOSIS — Z780 Asymptomatic menopausal state: Secondary | ICD-10-CM

## 2017-07-14 MED ORDER — METRONIDAZOLE 500 MG PO TABS: 500.0000 mg | ORAL_TABLET | Freq: Two times a day (BID) | ORAL | 0 refills | Status: AC

## 2017-07-14 NOTE — Progress Notes (Signed)
  History of Present Illness:  Pamela Mccormick is a 46 y.o. who was started on Norethinedrone  approximately 2 months ago. Since that time, she states that her symptoms are improving.  PMHx: She  has a past medical history of Abnormal mammogram, unspecified (2013) and Acid reflux. Also,  has a past surgical history that includes Tonsillectomy (AGE 46); Colonoscopy (AGE 31); skull fx  (1984); Breast surgery (Left, 2013); and Breast biopsy (Left, 05/2012)., family history includes CAD in her father; Diabetes in her father; Hypertension in her brother, father, mother, and sister; Lymphoma (age of onset: 7741) in her mother.,  reports that she has been smoking.  She has a 20.00 pack-year smoking history. she has never used smokeless tobacco. She reports that she drinks alcohol. She reports that she does not use drugs.  Current Outpatient Medications:  .  acyclovir (ZOVIRAX) 400 MG tablet, , Disp: , Rfl: 0 .  acyclovir (ZOVIRAX) 400 MG tablet, take 1 tablet by mouth three times a day AS NEEDED FOR ONE WEEK FOR COLD SORES, Disp: , Rfl:  .  cetirizine (ZYRTEC) 10 MG tablet, Take by mouth., Disp: , Rfl:  .  cyclobenzaprine (FLEXERIL) 5 MG tablet, Take 1 tablet (5 mg total) by mouth 3 (three) times daily as needed for muscle spasms. (Patient not taking: Reported on 03/21/2017), Disp: 30 tablet, Rfl: 0 .  fluticasone (FLONASE) 50 MCG/ACT nasal spray, Place into the nose., Disp: , Rfl:  .  HYDROcodone-acetaminophen (NORCO/VICODIN) 5-325 MG tablet, Take 1 tablet by mouth every 4 (four) hours as needed for moderate pain. (Patient not taking: Reported on 03/21/2017), Disp: 20 tablet, Rfl: 0 .  metroNIDAZOLE (FLAGYL) 500 MG tablet, Take 1 tablet (500 mg total) 2 (two) times daily for 7 days by mouth., Disp: 14 tablet, Rfl: 0 .  naproxen (NAPROSYN) 500 MG tablet, Take by mouth., Disp: , Rfl:  .  norethindrone (MICRONOR,CAMILA,ERRIN) 0.35 MG tablet, Take 1 tablet (0.35 mg total) by mouth daily., Disp: 1 Package, Rfl:  11 .  omeprazole (PRILOSEC) 20 MG capsule, Take 20 mg by mouth daily., Disp: , Rfl:   Also, is allergic to amoxicillin..  Review of Systems  All other systems reviewed and are negative.  Physical Exam:  BP 120/80   Ht 5\' 3"  (1.6 m)   Wt 223 lb (101.2 kg)   LMP 06/26/2017   BMI 39.50 kg/m  Body mass index is 39.5 kg/m. Constitutional: Well nourished, well developed female in no acute distress.  Abdomen: diffusely non tender to palpation, non distended, and no masses, hernias Neuro: Grossly intact Psych:  Normal mood and affect.    Assessment:  Problem List Items Addressed This Visit      Genitourinary   Abnormal perimenopausal bleeding     Other   Menopause - Perimenopausal sx's.     Medication treatment is going very well for her bleeding sx's w norethinedrone.  Cont daily.  Plan: She will undergo no change in her medical therapy.  She was amenable to this plan and we will see her back for annual/PRN.  Flagyl for d/c/ BV. B12 for energy  A total of 15 minutes were spent face-to-face with the patient during this encounter and over half of that time dealt with counseling and coordination of care.  Fatigue, abn bleeding, vaginitis, and perimenopausal sx's.   Annamarie MajorPaul Kameshia Madruga, MD, [facility]AC[facility], [facility] 07/14/2017  4:32 PM

## 2018-06-21 DIAGNOSIS — R21 Rash and other nonspecific skin eruption: Secondary | ICD-10-CM | POA: Insufficient documentation

## 2018-06-21 DIAGNOSIS — I890 Lymphedema, not elsewhere classified: Secondary | ICD-10-CM | POA: Insufficient documentation

## 2018-07-11 DIAGNOSIS — L930 Discoid lupus erythematosus: Secondary | ICD-10-CM | POA: Insufficient documentation

## 2018-10-25 ENCOUNTER — Other Ambulatory Visit: Payer: Self-pay | Admitting: Family Medicine

## 2018-10-25 DIAGNOSIS — Z1231 Encounter for screening mammogram for malignant neoplasm of breast: Secondary | ICD-10-CM

## 2018-10-29 ENCOUNTER — Ambulatory Visit
Admission: RE | Admit: 2018-10-29 | Discharge: 2018-10-29 | Disposition: A | Discharge status: Home / Self Care | Payer: BLUE CROSS/BLUE SHIELD | Source: Ambulatory Visit | Attending: Family Medicine | Admitting: Family Medicine

## 2018-10-29 DIAGNOSIS — Z1231 Encounter for screening mammogram for malignant neoplasm of breast: Secondary | ICD-10-CM | POA: Diagnosis present

## 2018-11-15 ENCOUNTER — Other Ambulatory Visit: Payer: Self-pay

## 2018-11-15 ENCOUNTER — Other Ambulatory Visit (HOSPITAL_COMMUNITY)
Admission: RE | Admit: 2018-11-15 | Discharge: 2018-11-15 | Disposition: A | Discharge status: Home / Self Care | Payer: BLUE CROSS/BLUE SHIELD | Source: Ambulatory Visit | Attending: Obstetrics & Gynecology | Admitting: Obstetrics & Gynecology

## 2018-11-15 ENCOUNTER — Encounter: Payer: Self-pay | Admitting: Obstetrics & Gynecology

## 2018-11-15 ENCOUNTER — Ambulatory Visit (INDEPENDENT_AMBULATORY_CARE_PROVIDER_SITE_OTHER): Payer: BLUE CROSS/BLUE SHIELD | Admitting: Obstetrics & Gynecology

## 2018-11-15 VITALS — BP 112/80 | Ht 63.0 in | Wt 223.0 lb

## 2018-11-15 DIAGNOSIS — Z01419 Encounter for gynecological examination (general) (routine) without abnormal findings: Secondary | ICD-10-CM | POA: Diagnosis not present

## 2018-11-15 DIAGNOSIS — Z124 Encounter for screening for malignant neoplasm of cervix: Secondary | ICD-10-CM | POA: Insufficient documentation

## 2018-11-15 DIAGNOSIS — Z780 Asymptomatic menopausal state: Secondary | ICD-10-CM

## 2018-11-15 DIAGNOSIS — Z1211 Encounter for screening for malignant neoplasm of colon: Secondary | ICD-10-CM

## 2018-11-15 MED ORDER — NORETHINDRONE 0.35 MG PO TABS: 1.0000 | ORAL_TABLET | Freq: Every day | ORAL | 4 refills | Status: DC

## 2018-11-15 NOTE — Progress Notes (Signed)
HPI:      Pamela Mccormick is a 48 y.o. G1P1 who LMP was Patient's last menstrual period was 11/07/2018., she presents today for her annual examination. The patient has no complaints today. The patient is sexually active. Her last pap: approximate date 2017 and was normal and last mammogram: approximate date 10/2018 and was normal. The patient does perform self breast exams.  There is no notable family history of breast or ovarian cancer in her family.  The patient has regular exercise: yes.  The patient denies current symptoms of depression.    Pt has done well with Norethindrone pills daily w reg cycles and min hot flash type sx's.  When she stopped for a few months, she had irreg bleeding and worsening menopausal sx's.  Nov- right axillary mass, that has sent come and gone in size but always seems to be present there in arm pit with some discomfort.  She has been seen by PCP and examined. Also had normal MMG last month.  GYN History: Contraception: oral progesterone-only contraceptive  PMHx: Past Medical History:  Diagnosis Date  . Abnormal mammogram, unspecified 2013   LEFT BREAST  . Acid reflux   . Lupus (HCC)    skin   Past Surgical History:  Procedure Laterality Date  . BREAST BIOPSY Left 05/2012   bx neg  . BREAST SURGERY Left 2013   left breast core biopsy  . COLONOSCOPY  AGE 33  . skull fx   1984   FRACTURE  . TONSILLECTOMY  AGE 89   Family History  Problem Relation Age of Onset  . Lymphoma Mother 27  . Hypertension Mother   . Diabetes Father   . CAD Father   . Hypertension Father   . Hypertension Sister   . Hypertension Brother   . Breast cancer Neg Hx   . Ovarian cancer Neg Hx    Social History   Tobacco Use  . Smoking status: Current Every Day Smoker    Packs/day: 1.00    Years: 20.00    Pack years: 20.00  . Smokeless tobacco: Never Used  Substance Use Topics  . Alcohol use: Yes  . Drug use: No    Current Outpatient Medications:  .   omeprazole (PRILOSEC) 20 MG capsule, Take 20 mg by mouth daily., Disp: , Rfl:  .  acyclovir (ZOVIRAX) 400 MG tablet, , Disp: , Rfl: 0 .  acyclovir (ZOVIRAX) 400 MG tablet, take 1 tablet by mouth three times a day AS NEEDED FOR ONE WEEK FOR COLD SORES, Disp: , Rfl:  .  cetirizine (ZYRTEC) 10 MG tablet, Take by mouth., Disp: , Rfl:  .  cyclobenzaprine (FLEXERIL) 5 MG tablet, Take 1 tablet (5 mg total) by mouth 3 (three) times daily as needed for muscle spasms. (Patient not taking: Reported on 03/21/2017), Disp: 30 tablet, Rfl: 0 .  fluticasone (FLONASE) 50 MCG/ACT nasal spray, Place into the nose., Disp: , Rfl:  .  naproxen (NAPROSYN) 500 MG tablet, Take by mouth., Disp: , Rfl:  .  norethindrone (MICRONOR,CAMILA,ERRIN) 0.35 MG tablet, Take 1 tablet (0.35 mg total) by mouth daily., Disp: 3 Package, Rfl: 4 Allergies: Amoxicillin  Review of Systems  Constitutional: Negative for chills, fever and malaise/fatigue.  HENT: Negative for congestion, sinus pain and sore throat.   Eyes: Negative for blurred vision and pain.  Respiratory: Negative for cough and wheezing.   Cardiovascular: Negative for chest pain and leg swelling.  Gastrointestinal: Negative for abdominal pain, constipation, diarrhea,  heartburn, nausea and vomiting.  Genitourinary: Negative for dysuria, frequency, hematuria and urgency.  Musculoskeletal: Negative for back pain, joint pain, myalgias and neck pain.  Skin: Negative for itching and rash.  Neurological: Negative for dizziness, tremors and weakness.  Endo/Heme/Allergies: Does not bruise/bleed easily.  Psychiatric/Behavioral: Negative for depression. The patient is not nervous/anxious and does not have insomnia.     Objective: BP 112/80   Ht  (1.6 m)   Wt 223 lb (101.2 kg)   LMP 11/07/2018   BMI 39.50 kg/m   Filed Weights   11/15/18 1529  Weight: 223 lb (101.2 kg)   Body mass index is 39.5 kg/m. Physical Exam Constitutional:      General: She is not in acute  distress.    Appearance: She is well-developed.  Genitourinary:     Pelvic exam was performed with patient supine.     Vagina, uterus and rectum normal.     No lesions in the vagina.     No vaginal bleeding.     No cervical motion tenderness, friability, lesion or polyp.     Uterus is mobile.     Uterus is not enlarged.     No uterine mass detected.    Uterus is midaxial.     No right or left adnexal mass present.     Right adnexa not tender.     Left adnexa not tender.  HENT:     Head: Normocephalic and atraumatic. No laceration.     Right Ear: Hearing normal.     Left Ear: Hearing normal.     Mouth/Throat:     Pharynx: Uvula midline.  Eyes:     Pupils: Pupils are equal, round, and reactive to light.  Neck:     Musculoskeletal: Normal range of motion and neck supple.     Thyroid: No thyromegaly.  Cardiovascular:     Rate and Rhythm: Normal rate and regular rhythm.     Heart sounds: No murmur. No friction rub. No gallop.   Pulmonary:     Effort: Pulmonary effort is normal. No respiratory distress.     Breath sounds: Normal breath sounds. No wheezing.  Chest:     Breasts:        Right: No mass, skin change or tenderness.        Left: No mass, skin change or tenderness.     Comments: No mobile or discreet mass found in right axilla or on exam Bilateral symmetric dense breast tissue as part of tail of breast into axillas No skin changes Abdominal:     General: Bowel sounds are normal. There is no distension.     Palpations: Abdomen is soft.     Tenderness: There is no abdominal tenderness. There is no rebound.  Musculoskeletal: Normal range of motion.  Neurological:     Mental Status: She is alert and oriented to person, place, and time.     Cranial Nerves: No cranial nerve deficit.  Skin:    General: Skin is warm and dry.  Psychiatric:        Judgment: Judgment normal.  Vitals signs reviewed.   Assessment:   1. Women's annual routine gynecological examination   2.  Menopause   3. Screening for cervical cancer   4. Screen for colon cancer    Screening Plan:            1.  Cervical Screening-  Pap smear done today  2. Breast screening- Exam annually and mammogram>40 planned  Consider referral for axillary swelling noted by patient more than exam, may be LN or cyst or inflammatory change based on description of sx's  3. Colonoscopy every 10 years, Hemoccult testing - after age 51  4. Labs managed by PCP  5. Counseling for contraception: oral progesterone-only contraceptive- uses these to aid in transition sx's of menopause    F/U  Return in about 1 year (around 11/15/2019) for Annual.  Annamarie Major, MD, [facility], [facility] 11/15/2018  4:03 PM

## 2018-11-15 NOTE — Patient Instructions (Signed)
PAP every three years Mammogram every year   Due next year Colonoscopy every 10 years- soon Labs yearly (with PCP)

## 2018-11-20 LAB — CYTOLOGY - PAP
DIAGNOSIS: NEGATIVE
HPV: NOT DETECTED

## 2018-12-03 ENCOUNTER — Telehealth: Payer: Self-pay

## 2018-12-03 ENCOUNTER — Other Ambulatory Visit: Payer: Self-pay

## 2018-12-03 ENCOUNTER — Telehealth: Payer: Self-pay | Admitting: Gastroenterology

## 2018-12-03 DIAGNOSIS — Z1211 Encounter for screening for malignant neoplasm of colon: Secondary | ICD-10-CM

## 2018-12-03 NOTE — Telephone Encounter (Signed)
Gastroenterology Pre-Procedure Review  Request Date: 03/29/2019 Requesting Physician: Dr. Maximino Greenland  PATIENT REVIEW QUESTIONS: The patient responded to the following health history questions as indicated:    1. Are you having any GI issues? no 2. Do you have a personal history of Polyps? yes (In earyly 20's ) 3. Do you have a family history of Colon Cancer or Polyps? yes (Uncles) 4. Diabetes Mellitus? no 5. Joint replacements in the past 12 months?no 6. Major health problems in the past 3 months?no 7. Any artificial heart valves, MVP, or defibrillator?no    MEDICATIONS & ALLERGIES:    Patient reports the following regarding taking any anticoagulation/antiplatelet therapy:   Plavix, Coumadin, Eliquis, Xarelto, Lovenox, Pradaxa, Brilinta, or Effient? no Aspirin? no  Patient confirms/reports the following medications:  Current Outpatient Medications  Medication Sig Dispense Refill  . acyclovir (ZOVIRAX) 400 MG tablet   0  . acyclovir (ZOVIRAX) 400 MG tablet take 1 tablet by mouth three times a day AS NEEDED FOR ONE WEEK FOR COLD SORES    . cetirizine (ZYRTEC) 10 MG tablet Take by mouth.    . cyclobenzaprine (FLEXERIL) 5 MG tablet Take 1 tablet (5 mg total) by mouth 3 (three) times daily as needed for muscle spasms. (Patient not taking: Reported on 03/21/2017) 30 tablet 0  . fluticasone (FLONASE) 50 MCG/ACT nasal spray Place into the nose.    . naproxen (NAPROSYN) 500 MG tablet Take by mouth.    . norethindrone (MICRONOR,CAMILA,ERRIN) 0.35 MG tablet Take 1 tablet (0.35 mg total) by mouth daily. 3 Package 4  . omeprazole (PRILOSEC) 20 MG capsule Take 20 mg by mouth daily.     No current facility-administered medications for this visit.     Patient confirms/reports the following allergies:  Allergies  Allergen Reactions  . Amoxicillin Other (See Comments)    Causes really bad yeast infections    No orders of the defined types were placed in this encounter.   AUTHORIZATION  INFORMATION Primary Insurance: 1D#: Group #:  Secondary Insurance: 1D#: Group #:  SCHEDULE INFORMATION: Date: 03/29/2019   Dr. Maximino Greenland Time: Location: [facility]

## 2018-12-03 NOTE — Telephone Encounter (Signed)
Patient requested a call back later on today to discuss colonoscopy instructions.  Instructions printed, colonoscopy ordered.  Thanks  [employer]

## 2018-12-04 ENCOUNTER — Telehealth: Payer: Self-pay

## 2018-12-04 NOTE — Telephone Encounter (Signed)
LVM for pt to call office to discuss colonoscopy instructions and address any concerns she may have regarding her procedure scheduled for 07/24.  Orders have been entered, letter prepared.  Thanks [employer]

## 2019-03-18 ENCOUNTER — Emergency Department: Payer: BC Managed Care – PPO

## 2019-03-18 ENCOUNTER — Emergency Department
Admission: EM | Admit: 2019-03-18 | Discharge: 2019-03-18 | Disposition: A | Discharge status: Home / Self Care | Payer: BC Managed Care – PPO | Attending: Emergency Medicine | Admitting: Emergency Medicine

## 2019-03-18 ENCOUNTER — Encounter: Payer: Self-pay | Admitting: Emergency Medicine

## 2019-03-18 ENCOUNTER — Other Ambulatory Visit: Payer: Self-pay

## 2019-03-18 DIAGNOSIS — F17200 Nicotine dependence, unspecified, uncomplicated: Secondary | ICD-10-CM | POA: Diagnosis not present

## 2019-03-18 DIAGNOSIS — R55 Syncope and collapse: Secondary | ICD-10-CM | POA: Insufficient documentation

## 2019-03-18 DIAGNOSIS — R002 Palpitations: Secondary | ICD-10-CM | POA: Diagnosis not present

## 2019-03-18 DIAGNOSIS — R42 Dizziness and giddiness: Secondary | ICD-10-CM | POA: Diagnosis present

## 2019-03-18 DIAGNOSIS — Z79899 Other long term (current) drug therapy: Secondary | ICD-10-CM | POA: Diagnosis not present

## 2019-03-18 DIAGNOSIS — R0602 Shortness of breath: Secondary | ICD-10-CM | POA: Diagnosis not present

## 2019-03-18 LAB — COMPREHENSIVE METABOLIC PANEL
ALT: 25 U/L (ref 0–44)
AST: 20 U/L (ref 15–41)
Albumin: 4 g/dL (ref 3.5–5.0)
Alkaline Phosphatase: 54 U/L (ref 38–126)
Anion gap: 9 (ref 5–15)
BUN: 14 mg/dL (ref 6–20)
CO2: 24 mmol/L (ref 22–32)
Calcium: 8.9 mg/dL (ref 8.9–10.3)
Chloride: 107 mmol/L (ref 98–111)
Creatinine, Ser: 0.93 mg/dL (ref 0.44–1.00)
GFR calc Af Amer: >60 mL/min (ref >60)
GFR calc non Af Amer: >60 mL/min (ref >60)
Glucose, Bld: 103 mg/dL — ABNORMAL HIGH (ref 70–99)
Potassium: 3.9 mmol/L (ref 3.5–5.1)
Sodium: 140 mmol/L (ref 135–145)
Total Bilirubin: 0.5 mg/dL (ref 0.3–1.2)
Total Protein: 6.6 g/dL (ref 6.5–8.1)

## 2019-03-18 LAB — CBC
HCT: 40.7 % (ref 36.0–46.0)
Hemoglobin: 13.5 g/dL (ref 12.0–15.0)
MCH: 30.8 pg (ref 26.0–34.0)
MCHC: 33.2 g/dL (ref 30.0–36.0)
MCV: 92.9 fL (ref 80.0–100.0)
Platelets: 270 10*3/uL (ref 150–400)
RBC: 4.38 MIL/uL (ref 3.87–5.11)
RDW: 13.7 % (ref 11.5–15.5)
WBC: 8.2 10*3/uL (ref 4.0–10.5)
nRBC: 0 % (ref 0.0–0.2)

## 2019-03-18 LAB — URINALYSIS, COMPLETE (UACMP) WITH MICROSCOPIC
Bacteria, UA: NONE SEEN
Bilirubin Urine: NEGATIVE
Glucose, UA: NEGATIVE mg/dL
Hgb urine dipstick: NEGATIVE
Ketones, ur: NEGATIVE mg/dL
Leukocytes,Ua: NEGATIVE
Nitrite: NEGATIVE
Protein, ur: NEGATIVE mg/dL
Specific Gravity, Urine: 1.018 (ref 1.005–1.030)
pH: 5 (ref 5.0–8.0)

## 2019-03-18 LAB — POC URINE PREG, ED: Preg Test, Ur: NEGATIVE

## 2019-03-18 LAB — TROPONIN I (HIGH SENSITIVITY)
Troponin I (High Sensitivity): 4 ng/L (ref <18)
Troponin I (High Sensitivity): 2 ng/L (ref <18)

## 2019-03-18 LAB — FIBRIN DERIVATIVES D-DIMER (ARMC ONLY): Fibrin derivatives D-dimer (ARMC): 369.59 ng/mL (FEU) (ref 0.00–499.00)

## 2019-03-18 MED ORDER — MECLIZINE HCL 25 MG PO TABS
25.0000 mg | ORAL_TABLET | Freq: Once | ORAL | Status: AC
  Administered 2019-03-18: 09:00:00 25 mg via ORAL
  Filled 2019-03-18: qty 1

## 2019-03-18 MED ORDER — ONDANSETRON HCL 4 MG/2ML IJ SOLN
4.0000 mg | Freq: Once | INTRAMUSCULAR | Status: AC
  Administered 2019-03-18: 08:00:00 4 mg via INTRAVENOUS
  Filled 2019-03-18: qty 2

## 2019-03-18 MED ORDER — ONDANSETRON 4 MG PO TBDP: 4.0000 mg | ORAL_TABLET | Freq: Four times a day (QID) | ORAL | 0 refills | Status: DC | PRN

## 2019-03-18 MED ORDER — MECLIZINE HCL 12.5 MG PO TABS: 12.5000 mg | ORAL_TABLET | Freq: Three times a day (TID) | ORAL | 1 refills | Status: DC | PRN

## 2019-03-18 MED ORDER — SODIUM CHLORIDE 0.9 % IV BOLUS
500.0000 mL | Freq: Once | INTRAVENOUS | Status: AC
  Administered 2019-03-18: 500 mL via INTRAVENOUS

## 2019-03-18 NOTE — ED Triage Notes (Signed)
[facility] pt to Rm  19 from work with report of became dizzy at work then nauseated. Has not thrown up. Pt reports only medical hx is reflux for which she takes omeprazole.

## 2019-03-18 NOTE — ED Notes (Signed)
Patient transported to X-ray 

## 2019-03-18 NOTE — ED Provider Notes (Signed)
[facility] Emergency Department Provider Note   ____________________________________________   First MD Initiated Contact with Patient 03/18/19 [phone]     (approximate)  I have reviewed the triage vital signs and the nursing notes.   HISTORY  Chief Complaint Dizziness and Nausea    HPI Pamela Mccormick is a 48 y.o. female   here for evaluation for feeling lightheaded and then dizzy  Patient reports she went to work about 4 AM, she had been walking about work and she started to feel a little bit lightheaded.  She also started to feel hot.  She went to her air-conditioned office after she been walking and feeling lightheaded, when she got there she started to feel like she is going to pass out.  She called a Radio broadcast assistantcoworker in.  She got very hot sweaty.  Did not experience any pain.  Some slight nausea.  Is feeling of lightheadedness and nausea persisted as she sat in her office for several minutes, prompting her to call rescue squad.  She also felt like her heart was racing and had a tingly or tense feeling in her hands and calf muscles  She reports she feels much better now.  She reports the paramedics when they got her saw her heart rate was elevated but told her that came down throughout her transport.  She denies any headache.  No numbness tingling or weakness except for strange tingling in the hands and legs that accompanied this and is now resolved.  She does have some mild nausea and just reports she just does not quite feel well at the moment.  Denies pregnancy.  No abdominal pain.  No chest pain except she notes some achiness up underneath her right armpit area.  No exposure to coronavirus he knows of.  No fevers or chills.  No loss of taste.  No coughing.  Past Medical History:  Diagnosis Date  . Abnormal mammogram, unspecified 2013   LEFT BREAST  . Acid reflux   . Lupus (HCC)    skin    Patient Active Problem List   Diagnosis Date Noted  .  Abnormal perimenopausal bleeding 07/14/2017  . Screening for breast cancer 05/01/2017  . Menopause 05/01/2017  . Hip pain 06/20/2015  . Abnormal mammogram, unspecified   . GERD (gastroesophageal reflux disease) 10/08/2012  . 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 Tobacco use 10/08/2012    Past Surgical History:  Procedure Laterality Date  . BREAST BIOPSY Left 05/2012   bx neg  . BREAST SURGERY Left 2013   left breast core biopsy  . COLONOSCOPY  AGE 10  . skull fx   1984   FRACTURE  . TONSILLECTOMY  AGE 38    Prior to Admission medications   Medication Sig Start Date End Date Taking? Authorizing Provider  acyclovir (ZOVIRAX) 400 MG tablet  02/13/17   [provider]  acyclovir (ZOVIRAX) 400 MG tablet take 1 tablet by mouth three times a day AS NEEDED FOR ONE WEEK FOR COLD SORES 08/23/16   [provider]  cetirizine (ZYRTEC) 10 MG tablet Take by mouth. 06/24/16 06/24/17  [provider]  cyclobenzaprine (FLEXERIL) 5 MG tablet Take 1 tablet (5 mg total) by mouth 3 (three) times daily as needed for muscle spasms. Patient not taking: Reported on 03/21/2017 06/21/15   Adrian SaranMody, Sital, MD  fluticasone (FLONASE) 50 MCG/ACT nasal spray Place into the nose. 06/24/16 06/24/17  [provider]  meclizine (ANTIVERT) 12.5 MG tablet Take 1 tablet (12.5  mg total) by mouth 3 (three) times daily as needed for dizziness or nausea. 03/18/19   Sharyn CreamerQuale, Mark, MD  naproxen (NAPROSYN) 500 MG tablet Take by mouth. 02/16/17   [provider]  norethindrone (MICRONOR,CAMILA,ERRIN) 0.35 MG tablet Take 1 tablet (0.35 mg total) by mouth daily. 11/15/18   Nadara MustardHarris, Robert P, MD  omeprazole (PRILOSEC) 20 MG capsule Take 20 mg by mouth daily.    [provider]  ondansetron (ZOFRAN ODT) 4 MG disintegrating tablet Take 1 tablet (4 mg total) by mouth every 6 (six) hours as needed for nausea or vomiting. 03/18/19   Sharyn CreamerQuale, Mark, MD    Allergies Amoxicillin  Family History  Problem  Relation Age of Onset  . Lymphoma Mother 5341  . Hypertension Mother   . Diabetes Father   . CAD Father   . Hypertension Father   . Hypertension Sister   . Hypertension Brother   . Breast cancer Neg Hx   . Ovarian cancer Neg Hx     Social History Social History   Tobacco Use  . Smoking status: Current Every Day Smoker    Packs/day: 1.00    Years: 20.00    Pack years: 20.00  . Smokeless tobacco: Never Used  Substance Use Topics  . Alcohol use: Yes  . Drug use: No    Review of Systems Constitutional: No fever/chills Eyes: No visual changes. ENT: No sore throat. Cardiovascular: Denies chest pain except an achy feeling underneath her right armpit she described as mild. Respiratory: Mild to moderate shortness of breath accompanied symptoms, this is starting to improve now. Gastrointestinal: No abdominal pain.   Genitourinary: Negative for dysuria. Musculoskeletal: Negative for back pain. Skin: Negative for rash. Neurological: Negative for headaches or focal weakness.    ____________________________________________   PHYSICAL EXAM:  VITAL SIGNS: ED Triage Vitals  Enc Vitals Group     BP 03/18/19 0734 (!) 148/98     Pulse Rate 03/18/19 0734 60     Resp 03/18/19 0734 18     Temp 03/18/19 0744 98.3 F (36.8 C)     Temp Source 03/18/19 0744 Oral     SpO2 03/18/19 0734 99 %     Weight 03/18/19 0647 220 lb 7.4 oz (100 kg)     Height 03/18/19 0647 5\' 3"  (1.6 m)     Head Circumference --      Peak Flow --      Pain Score 03/18/19 0646 4     Pain Loc --      Pain Edu? --      Excl. in GC? --     Constitutional: Alert and oriented. Well appearing and in no acute distress.  She is very pleasant.  Her vital signs are normal except for some mild hypertension. Eyes: Conjunctivae are normal. Head: Atraumatic. Nose: No congestion/rhinnorhea. Mouth/Throat: Mucous membranes are moist. Neck: No stridor.  Cardiovascular: Normal rate, regular rhythm. Grossly normal heart  sounds.  Good peripheral circulation. Respiratory: Normal respiratory effort.  No retractions. Lungs CTAB. Gastrointestinal: Soft and nontender. No distention. Musculoskeletal: No lower extremity tenderness nor edema. Neurologic:  Normal speech and language. No gross focal neurologic deficits are appreciated.  Normal cranial nerve exam.  Normal strength in all extremities.  No pronator drift in any extremity.  Normal sensation in all extremities.  No ataxia.  Clear speech. Skin:  Skin is warm, dry and intact. No rash noted. Psychiatric: Mood and affect are normal. Speech and behavior are normal.  ____________________________________________  LABS (all labs ordered are listed, but only abnormal results are displayed)  Labs Reviewed  COMPREHENSIVE METABOLIC PANEL - Abnormal; Notable for the following components:      Result Value   Glucose, Bld 103 (*)    All other components within normal limits  URINALYSIS, COMPLETE (UACMP) WITH MICROSCOPIC - Abnormal; Notable for the following components:   Color, Urine YELLOW (*)    APPearance CLEAR (*)    All other components within normal limits  CBC  FIBRIN DERIVATIVES D-DIMER ([facility] ONLY)  POC URINE PREG, ED  TROPONIN I (HIGH SENSITIVITY)  TROPONIN I (HIGH SENSITIVITY)   ____________________________________________  EKG  ED ECG REPORT I, Delman Kitten, the attending physician, personally viewed and interpreted this ECG.  Date: 03/18/2019 EKG Time: 655 Rate: 70 Rhythm: normal sinus rhythm QRS Axis: normal Intervals: normal ST/T Wave abnormalities: normal Narrative Interpretation: no evidence of acute ischemia  ____________________________________________  RADIOLOGY  Dg Chest 2 View  Result Date: 03/18/2019 CLINICAL DATA:  Dizziness and nausea. EXAM: CHEST - 2 VIEW COMPARISON:  CT of the chest October 19, 2011 FINDINGS: Cardiomediastinal silhouette is normal. Mediastinal contours appear intact. There is no evidence of focal  airspace consolidation, pleural effusion or pneumothorax. Osseous structures are without acute abnormality. Soft tissues are grossly normal. IMPRESSION: No active cardiopulmonary disease. Electronically Signed   By: Fidela Salisbury M.D.   On: 03/18/2019 08:15   Mr Brain Wo Contrast  Result Date: 03/18/2019 CLINICAL DATA:  Acute onset dizziness with associated nausea today. EXAM: MRI HEAD WITHOUT CONTRAST TECHNIQUE: Multiplanar, multiecho pulse sequences of the brain and surrounding structures were obtained without intravenous contrast. COMPARISON:  Head CT 07/06/2004 FINDINGS: Brain: Diffusion imaging does not show any acute or subacute infarction. Brainstem and cerebellum are normal. Cerebral hemispheres are normal except for a few scattered foci of T2 and FLAIR signal primarily within the subcortical white matter. No cortical abnormality. No mass lesion, hemorrhage, hydrocephalus or extra-axial collection. Vascular: Major vessels at the base of the brain show flow. Skull and upper cervical spine: Negative Sinuses/Orbits: Clear/normal Other: None IMPRESSION: No acute finding or specific explanation for the clinical presentation. Scattered punctate foci of T2 and FLAIR signal within the cerebral hemispheric subcortical white matter which are nonspecific. These could relate to an early manifestation of small vessel disease, particularly given the history of lupus. Electronically Signed   By: Nelson Chimes M.D.   On: 03/18/2019 11:50    MRI reviewed negative for acute, additional findings as above.  Chest x-ray no acute ____________________________________________   PROCEDURES  Procedure(s) performed: None  Procedures  Critical Care performed: No  ____________________________________________   INITIAL IMPRESSION / ASSESSMENT AND PLAN / ED COURSE  Pertinent labs & imaging results that were available during my care of the patient were reviewed by me and considered in my medical decision making  (see chart for details).   Patient presents evaluation of lightheadedness, feeling fatigue and then felt very hot and lightheaded as though she is got a pass out but did not.  She does report that she got up this morning had coffee, 2 bites of toast, and has not had a full meal yet today however this is not unusual for her.  She reports her symptoms started progressed as she walked and when she got to her office.  She has no acute neurologic findings.  Her symptoms of bilateral paresthesia and cramping in the hands and calf that is now resolved question if this could be related to potential hyperventilation  I am not certain.  Will check lab tests, EKG reassuring.  Will check a troponin, also send a d-dimer as she reports she had some shortness of breath accompanying this palpitations and achiness in the right upper armpit region overall I see no signs or suggestion she would have a DVT or PE on exam.  Oxygen saturation is normal and she is resting comfortably now but just feels slightly nauseated.    Clinical Course as of Mar 17 1250  Mon Mar 18, 2019  [id number] Low risk by well's, low pre-test probability for DVT/PE. DDimer < 500.    [MQ]  0902 Dated patient on results.  She reports that now she is noticed when she gets up or moves about that she starts feeling a spinning sensation, worse when she tries to look around.  Discussed with the patient, will proceed with MRI after discussion of risks and benefits of MRI, versus CT scan to evaluate as I suspect her symptoms that she is denoting now are related to vertigo.   [MQ]    Clinical Course User Index [MQ] Sharyn CreamerQuale, Mark, MD   ----------------------------------------- 12:51 PM on 03/18/2019 -----------------------------------------  Patient feels improved.  Resting comfortably.  Reviewed MRI result.  High-sensitivity troponins negative x2 no chest pain.  Discussed with patient, will treat for appears to be vertigo and possibly some dehydration.  Near  syncopal episode.  Patient comfortable plan for discharge.  Return precautions and treatment recommendations and follow-up discussed with the patient who is agreeable with the plan.  Pamela Mccormick was evaluated in Emergency Department on 03/18/2019 for the symptoms described in the history of present illness. She was evaluated in the context of the global COVID-19 pandemic, which necessitated consideration that the patient might be at risk for infection with the SARS-CoV-2 virus that causes COVID-19. Institutional protocols and algorithms that pertain to the evaluation of patients at risk for COVID-19 are in a state of rapid change based on information released by regulatory bodies including the CDC and federal and state organizations. These policies and algorithms were followed during the patient's care in the ED.  No covid symptoms noted.  ____________________________________________   FINAL CLINICAL IMPRESSION(S) / ED DIAGNOSES  Final diagnoses:  Vertigo  Near syncope        Note:  This document was prepared using Dragon voice recognition software and may include unintentional dictation errors       Sharyn CreamerQuale, Mark, MD 03/18/19 1252

## 2019-03-20 ENCOUNTER — Telehealth: Payer: Self-pay | Admitting: Gastroenterology

## 2019-03-20 NOTE — Telephone Encounter (Signed)
Pt left vm she states she needs to reschedule her procedure for 03/29/19 due to having vertigo please call pt

## 2019-03-20 NOTE — Telephone Encounter (Signed)
Returned patients call to reschedule her colonoscopy with Dr. Bonna Gains from 03/29/19 to 04/19/19 due to dealing with Vertigo.  Wannetta Sender is Endo has been made aware of procedure date change and I will send new instructions to reflect date change and new COVID date.  Thanks, Sharyn Lull

## 2019-04-10 ENCOUNTER — Telehealth: Payer: Self-pay

## 2019-04-10 NOTE — Telephone Encounter (Signed)
Patient has requested to cancel her colonoscopy (04/19/19)  due to her work schedule.  She will call back in a few weeks to reschedule.  Thanks [employer]

## 2019-04-19 ENCOUNTER — Ambulatory Visit: Admit: 2019-04-19 | Payer: BC Managed Care – PPO | Admitting: Gastroenterology

## 2019-04-19 SURGERY — COLONOSCOPY WITH PROPOFOL
Anesthesia: General

## 2019-05-21 ENCOUNTER — Ambulatory Visit: Payer: Self-pay

## 2019-05-21 ENCOUNTER — Other Ambulatory Visit: Payer: Self-pay

## 2019-05-21 DIAGNOSIS — Z23 Encounter for immunization: Secondary | ICD-10-CM

## 2019-05-21 NOTE — Patient Instructions (Signed)

## 2019-05-21 NOTE — Progress Notes (Signed)
     Patient ID: Pamela Mccormick, female    DOB: 06/13/1971, 48 y.o.   MRN: [id number]    Thank you!!  [place] Nurse Specialist [place]: [phone]  Cell:  [phone] Website: [email or web address]

## 2019-06-14 ENCOUNTER — Ambulatory Visit: Payer: Self-pay

## 2019-06-14 ENCOUNTER — Other Ambulatory Visit: Payer: Self-pay

## 2019-06-14 VITALS — BP 128/76 | HR 77 | Resp 16 | Ht 64.0 in | Wt 223.8 lb

## 2019-06-14 DIAGNOSIS — Z008 Encounter for other general examination: Secondary | ICD-10-CM

## 2019-06-14 DIAGNOSIS — M79672 Pain in left foot: Secondary | ICD-10-CM

## 2019-06-14 LAB — POCT LIPID PANEL
HDL: 63
LDL: 81
Non-HDL: 102
POC Glucose: 122 mg/dl — AB (ref 70–99)
TC/HDL: 2.6
TC: 165
TRG: 103

## 2019-06-14 NOTE — Progress Notes (Signed)
     Patient ID: Pamela Mccormick, female    DOB: September 15, 1970, 48 y.o.   MRN: [id number]    Thank you!!  [place] Nurse Specialist [place]: [phone]  Cell:  [phone] Website: [email or web address]

## 2019-06-17 ENCOUNTER — Other Ambulatory Visit: Payer: Self-pay

## 2019-06-17 ENCOUNTER — Ambulatory Visit (INDEPENDENT_AMBULATORY_CARE_PROVIDER_SITE_OTHER): Payer: BC Managed Care – PPO

## 2019-06-17 ENCOUNTER — Ambulatory Visit: Payer: BC Managed Care – PPO | Admitting: Podiatry

## 2019-06-17 DIAGNOSIS — M659 Synovitis and tenosynovitis, unspecified: Secondary | ICD-10-CM

## 2019-06-17 DIAGNOSIS — M79672 Pain in left foot: Secondary | ICD-10-CM

## 2019-06-17 DIAGNOSIS — M778 Other enthesopathies, not elsewhere classified: Secondary | ICD-10-CM

## 2019-06-17 MED ORDER — METHYLPREDNISOLONE 4 MG PO TBPK: ORAL_TABLET | ORAL | 0 refills | Status: DC

## 2019-06-17 MED ORDER — MELOXICAM 15 MG PO TABS: 15.0000 mg | ORAL_TABLET | Freq: Every day | ORAL | 1 refills | Status: DC

## 2019-06-18 ENCOUNTER — Other Ambulatory Visit: Payer: Self-pay | Admitting: Podiatry

## 2019-06-18 DIAGNOSIS — M659 Synovitis and tenosynovitis, unspecified: Secondary | ICD-10-CM

## 2019-06-20 NOTE — Progress Notes (Signed)
   Subjective:  48 y.o. female presenting today with a chief complaint of severe left dorsal foot pain that began about one week ago. She reports associated swelling. She has not had any treatment for the symptoms. Walking and being on the foot increases the pain. Patient is here for further evaluation and treatment.   Past Medical History:  Diagnosis Date  . Abnormal mammogram, unspecified 2013   LEFT BREAST  . Acid reflux   . 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 Dermatology:  Skin is warm, dry and supple bilateral lower extremities. Negative for open lesions or macerations. Vascular:  Palpable pedal pulses bilaterally. No edema or erythema noted. Capillary refill within normal limits. Neurological:  Epicritic and protective threshold grossly intact bilaterally.  Musculoskeletal Exam:  Pain on palpation to the anterior lateral medial aspects of the patient's left ankle. Mild edema noted. Pain with palpation noted to the left midfoot. Range of motion within normal limits to all pedal and ankle joints bilateral. Muscle strength 5/5 in all groups bilateral.   Radiographic Exam:  Normal osseous mineralization. Joint spaces preserved. No fracture/dislocation/boney destruction.    Assessment: 1. Left ankle synovitis 2. Left midfoot capsulitis   Plan of Care:  1. Patient was evaluated. X-Rays reviewed.  2. Injection of 0.5 mL Celestone Soluspan injected in the patient's left ankle. 3. Injection of 0.5 mLs Celestone Soluspan injected into the left midfoot.  4. Prescription for Medrol Dose Pak provided to patient. 5. Prescription for Meloxicam provided to patient. 6. CAM boot dispensed. Weightbearing as tolerated.  7. Return to clinic in 4 weeks.    Edrick Kins, DPM [facility]  Dr. Edrick Kins, [address]                                        [place],  [place] [address]                Office [phone]  Fax [phone]

## 2019-07-10 ENCOUNTER — Other Ambulatory Visit: Payer: Self-pay | Admitting: *Deleted

## 2019-07-10 ENCOUNTER — Telehealth: Payer: Self-pay

## 2019-07-10 DIAGNOSIS — Z20822 Contact with and (suspected) exposure to covid-19: Secondary | ICD-10-CM

## 2019-07-10 NOTE — Telephone Encounter (Signed)
Received call stating she was congested, sore throat, more lethargic in the past 2 days that she attributed to stress at work, lower back pain but its possible its time for her menstrual or her kidney pain. I have advised her to be tested at this time.

## 2019-07-12 LAB — NOVEL CORONAVIRUS, NAA: SARS-CoV-2, NAA: NOT DETECTED

## 2019-07-16 ENCOUNTER — Ambulatory Visit: Payer: BC Managed Care – PPO | Admitting: Podiatry

## 2019-07-16 ENCOUNTER — Other Ambulatory Visit: Payer: Self-pay

## 2019-07-16 DIAGNOSIS — Z20822 Contact with and (suspected) exposure to covid-19: Secondary | ICD-10-CM

## 2019-07-17 ENCOUNTER — Telehealth: Payer: Self-pay

## 2019-07-17 NOTE — Telephone Encounter (Signed)
Patient called to see if her results are posted. No results posted at this time.

## 2019-07-18 LAB — NOVEL CORONAVIRUS, NAA: SARS-CoV-2, NAA: NOT DETECTED

## 2019-10-10 ENCOUNTER — Other Ambulatory Visit: Payer: Self-pay | Admitting: Family Medicine

## 2019-10-10 DIAGNOSIS — Z1231 Encounter for screening mammogram for malignant neoplasm of breast: Secondary | ICD-10-CM

## 2019-10-31 ENCOUNTER — Ambulatory Visit
Admission: RE | Admit: 2019-10-31 | Discharge: 2019-10-31 | Disposition: A | Discharge status: Home / Self Care | Payer: BC Managed Care – PPO | Source: Ambulatory Visit | Attending: Family Medicine | Admitting: Family Medicine

## 2019-10-31 ENCOUNTER — Other Ambulatory Visit: Payer: Self-pay

## 2019-10-31 DIAGNOSIS — Z1231 Encounter for screening mammogram for malignant neoplasm of breast: Secondary | ICD-10-CM | POA: Insufficient documentation

## 2019-11-19 ENCOUNTER — Other Ambulatory Visit: Payer: Self-pay

## 2019-11-19 ENCOUNTER — Encounter: Payer: Self-pay | Admitting: Obstetrics & Gynecology

## 2019-11-19 ENCOUNTER — Ambulatory Visit (INDEPENDENT_AMBULATORY_CARE_PROVIDER_SITE_OTHER): Payer: BC Managed Care – PPO | Admitting: Obstetrics & Gynecology

## 2019-11-19 VITALS — BP 130/90 | Ht 63.0 in | Wt 203.0 lb

## 2019-11-19 DIAGNOSIS — Z780 Asymptomatic menopausal state: Secondary | ICD-10-CM | POA: Diagnosis not present

## 2019-11-19 DIAGNOSIS — Z01419 Encounter for gynecological examination (general) (routine) without abnormal findings: Secondary | ICD-10-CM

## 2019-11-19 DIAGNOSIS — Z1211 Encounter for screening for malignant neoplasm of colon: Secondary | ICD-10-CM

## 2019-11-19 NOTE — Progress Notes (Signed)
HPI:      [name] is a 50 y.o. G1P1 who LMP was in the past, she presents today for her annual examination.  The patient has no complaints today. The patient is sexually active. Herlast pap: approximate date 2020 and was normal and last mammogram: approximate date 10/2019 and was normal.  The patient does perform self breast exams.  There is no notable family history of breast or ovarian cancer in her family. The patient is not taking hormone replacement therapy- she topped her norethinedrone therapy last summer due to feeling she did not need it anymore.  She has had two periods since stopping, last one 04/2019. Most;ly has fatigue and weigh gain.  The patient has regular exercise: yes. The patient denies current symptoms of depression.    GYN Hx: Last Colonoscopy: not yet   PMHx: Past Medical History:  Diagnosis Date  . Abnormal mammogram, unspecified 2013   LEFT BREAST  . Acid reflux   . 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 BREAST BIOPSY Left 05/2012   bx neg  . BREAST SURGERY Left 2013   left breast core biopsy  . COLONOSCOPY  AGE 68  . skull fx   1984   FRACTURE  . TONSILLECTOMY  AGE 69   Family History  Problem Relation Age of Onset  . Lymphoma Mother 11  . Hypertension Mother   . Diabetes Father   . CAD Father   . Hypertension Father   . Hypertension Sister   . Hypertension Brother   . Breast cancer Neg Hx   . Ovarian cancer Neg Hx    Social History   Tobacco Use  . Smoking status: Current Every Day Smoker    Packs/day: 1.00    Years: 20.00    Pack years: 20.00  . Smokeless tobacco: Never Used  Substance Use Topics  . Alcohol use: Yes  . Drug use: No    Current Outpatient Medications:  .  omeprazole (PRILOSEC) 20 MG capsule, Take 20 mg by mouth daily., Disp: , Rfl:  .  acyclovir (ZOVIRAX) 400 MG tablet, , Disp: , Rfl: 0 .  acyclovir (ZOVIRAX) 400 MG tablet, take 1 tablet by mouth three times a day AS  NEEDED FOR ONE WEEK FOR COLD SORES, Disp: , Rfl:  .  cetirizine (ZYRTEC) 10 MG tablet, Take by mouth., Disp: , Rfl:  .  cyclobenzaprine (FLEXERIL) 5 MG tablet, Take 1 tablet (5 mg total) by mouth 3 (three) times daily as needed for muscle spasms. (Patient not taking: Reported on 11/19/2019), Disp: 30 tablet, Rfl: 0 .  escitalopram (LEXAPRO) 10 MG tablet, Take by mouth., Disp: , Rfl:  .  fluticasone (FLONASE) 50 MCG/ACT nasal spray, Place into the nose., Disp: , Rfl:  .  meclizine (ANTIVERT) 12.5 MG tablet, Take 1 tablet (12.5 mg total) by mouth 3 (three) times daily as needed for dizziness or nausea. (Patient not taking: Reported on 11/19/2019), Disp: 30 tablet, Rfl: 1 .  meloxicam (MOBIC) 15 MG tablet, Take 1 tablet (15 mg total) by mouth daily. (Patient not taking: Reported on 11/19/2019), Disp: 30 tablet, Rfl: 1 .  methylPREDNISolone (MEDROL DOSEPAK) 4 MG TBPK tablet, 6 day dose pack - take as directed (Patient not taking: Reported on 11/19/2019), Disp: 21 tablet, Rfl: 0 .  naproxen (NAPROSYN) 500 MG tablet, Take by mouth., Disp: , Rfl:  .  ondansetron (ZOFRAN ODT) 4 MG disintegrating tablet, Take 1  tablet (4 mg total) by mouth every 6 (six) hours as needed for nausea or vomiting. (Patient not taking: Reported on 11/19/2019), Disp: 20 tablet, Rfl: 0 Allergies: Amoxicillin  Review of Systems  Constitutional: Positive for malaise/fatigue. Negative for chills and fever.  HENT: Negative for congestion, sinus pain and sore throat.   Eyes: Negative for blurred vision and pain.  Respiratory: Negative for cough and wheezing.   Cardiovascular: Negative for chest pain and leg swelling.  Gastrointestinal: Negative for abdominal pain, constipation, diarrhea, heartburn, nausea and vomiting.  Genitourinary: Negative for dysuria, frequency, hematuria and urgency.  Musculoskeletal: Negative for back pain, joint pain, myalgias and neck pain.  Skin: Negative for itching and rash.  Neurological: Negative for  dizziness, tremors and weakness.  Endo/Heme/Allergies: Does not bruise/bleed easily.  Psychiatric/Behavioral: Negative for depression. The patient is not nervous/anxious and does not have insomnia.     Objective: BP 130/90   Ht 5\' 3"  (1.6 m)   Wt 203 lb (92.1 kg)   BMI 35.96 kg/m   Filed Weights   11/19/19 1510  Weight: 203 lb (92.1 kg)   Body mass index is 35.96 kg/m. Physical Exam Constitutional:      General: She is not in acute distress.    Appearance: She is well-developed.  Genitourinary:     Pelvic exam was performed with patient supine.     Vagina, uterus and rectum normal.     No lesions in the vagina.     No vaginal bleeding.     No cervical motion tenderness, friability, lesion or polyp.     Uterus is mobile.     Uterus is not enlarged.     No uterine mass detected.    Uterus is midaxial.     No right or left adnexal mass present.     Right adnexa not tender.     Left adnexa not tender.  HENT:     Head: Normocephalic and atraumatic. No laceration.     Right Ear: Hearing normal.     Left Ear: Hearing normal.     Mouth/Throat:     Pharynx: Uvula midline.  Eyes:     Pupils: Pupils are equal, round, and reactive to light.  Neck:     Thyroid: No thyromegaly.  Cardiovascular:     Rate and Rhythm: Normal rate and regular rhythm.     Heart sounds: No murmur. No friction rub. No gallop.   Pulmonary:     Effort: Pulmonary effort is normal. No respiratory distress.     Breath sounds: Normal breath sounds. No wheezing.  Chest:     Breasts:        Right: No mass, skin change or tenderness.        Left: No mass, skin change or tenderness.  Abdominal:     General: Bowel sounds are normal. There is no distension.     Palpations: Abdomen is soft.     Tenderness: There is no abdominal tenderness. There is no rebound.  Musculoskeletal:        General: Normal range of motion.     Cervical back: Normal range of motion and neck supple.  Neurological:     Mental  Status: She is alert and oriented to person, place, and time.     Cranial Nerves: No cranial nerve deficit.  Skin:    General: Skin is warm and dry.  Psychiatric:        Judgment: Judgment normal.  Vitals reviewed.  Assessment: Annual Exam 1. Women's annual routine gynecological examination   2. Menopause   3. Screen for colon cancer     Plan:            1.  Cervical Screening-  Pap smear schedule reviewed with patient, due 2023  2. Breast screening- Exam annually and mammogram scheduled, UTD  3. Colonoscopy every 10 years, Hemoccult testing after age 81, due soon, referral placed  4. Labs managed by PCP Appt in April  5. Counseling for hormonal therapy: none Stopped last year, no desire or need for treatment of menopausal sx's              6. FRAX - FRAX score for assessing the 10 year probability for fracture calculated and discussed today.  Based on age and score today, DEXA is not currently scheduled.    F/U  Return in about 1 year (around 11/18/2020) for Annual.  Annamarie Major, MD, [facility], [facility] 11/19/2019  3:38 PM

## 2019-11-19 NOTE — Patient Instructions (Signed)
PAP every three years Mammogram every year    Call [phone] to schedule at [facility] Colonoscopy every 10 years Labs yearly (with PCP)   

## 2020-07-23 IMAGING — MG DIGITAL SCREENING BILATERAL MAMMOGRAM WITH TOMO AND CAD
6 of 12 series · 6 of 36 positions shown · non-contrast
Comparison: Previous exam(s).

CLINICAL DATA: Screening.

EXAM:
DIGITAL SCREENING BILATERAL MAMMOGRAM WITH TOMO AND CAD

[L MLO synth-2D]
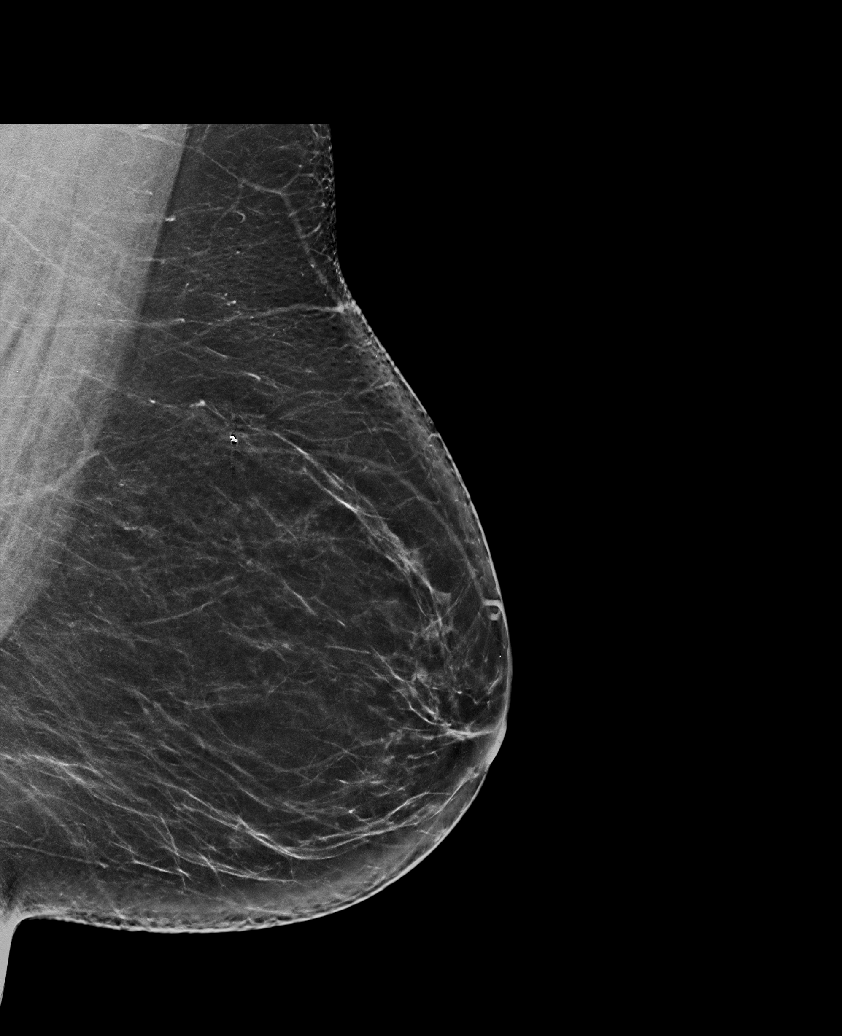

[R CC synth-2D (1 of 2)]
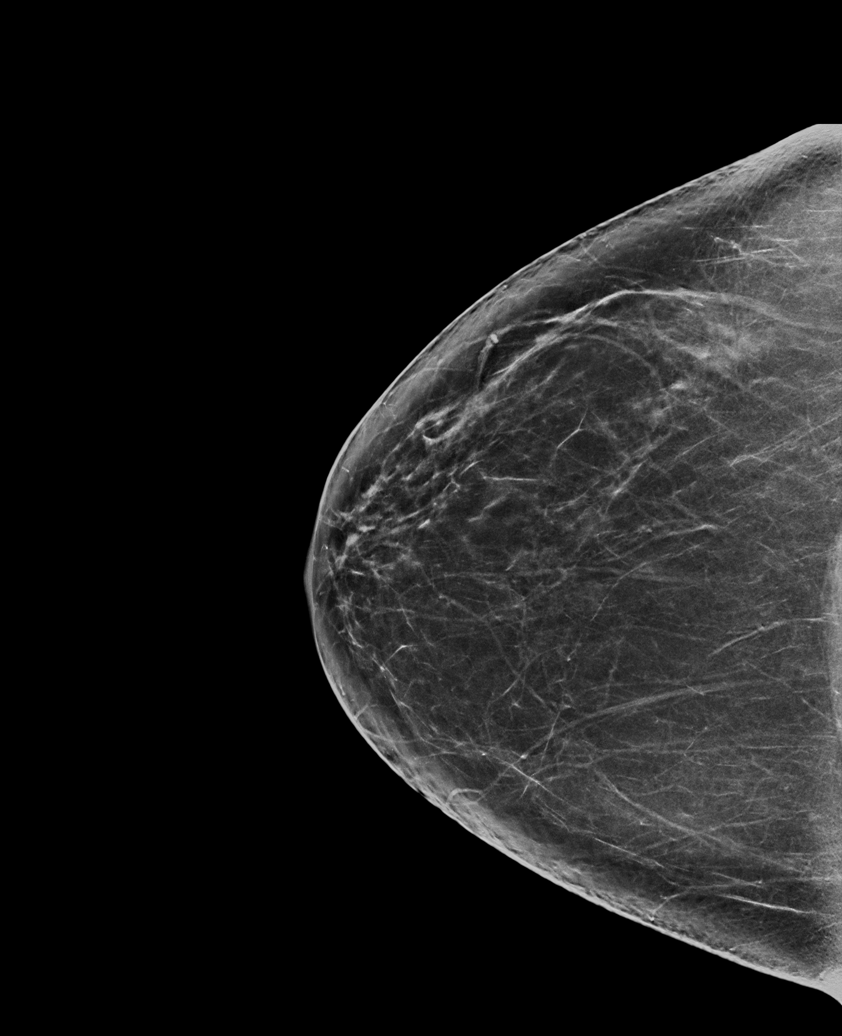

[R MLO synth-2D]
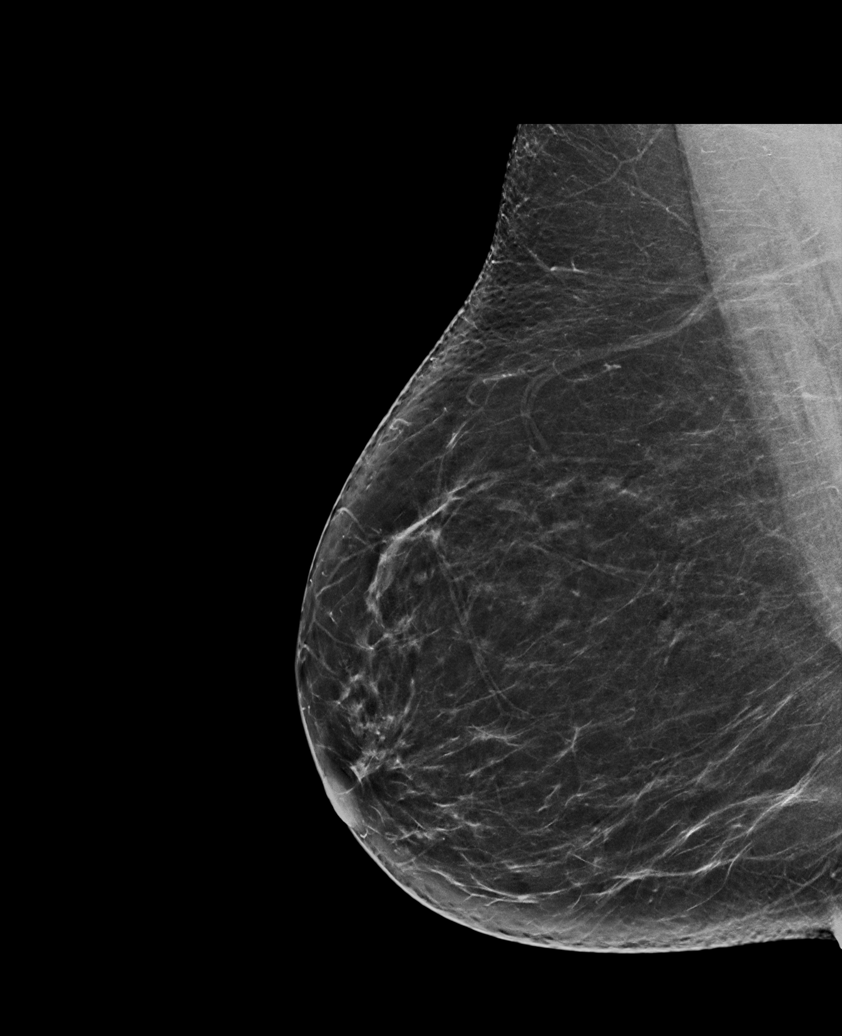

[L CC synth-2D (1 of 2)]
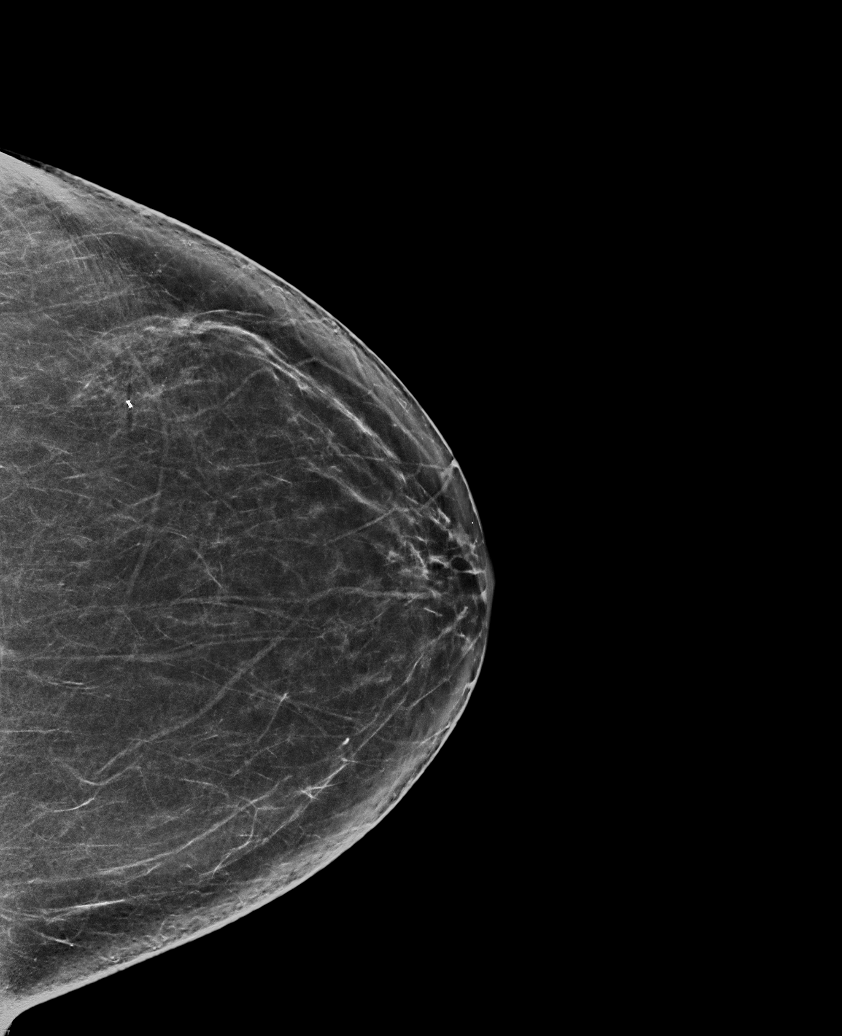

[L CC synth-2D (2 of 2)]
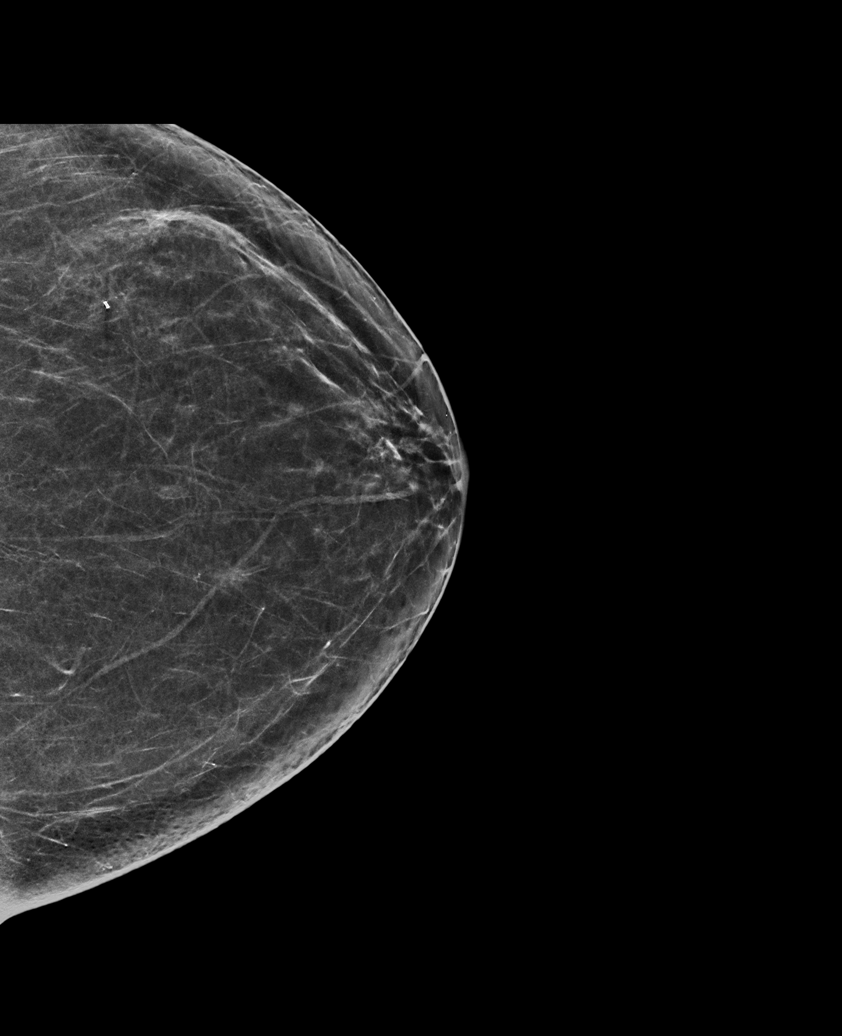

[R CC synth-2D (2 of 2)]
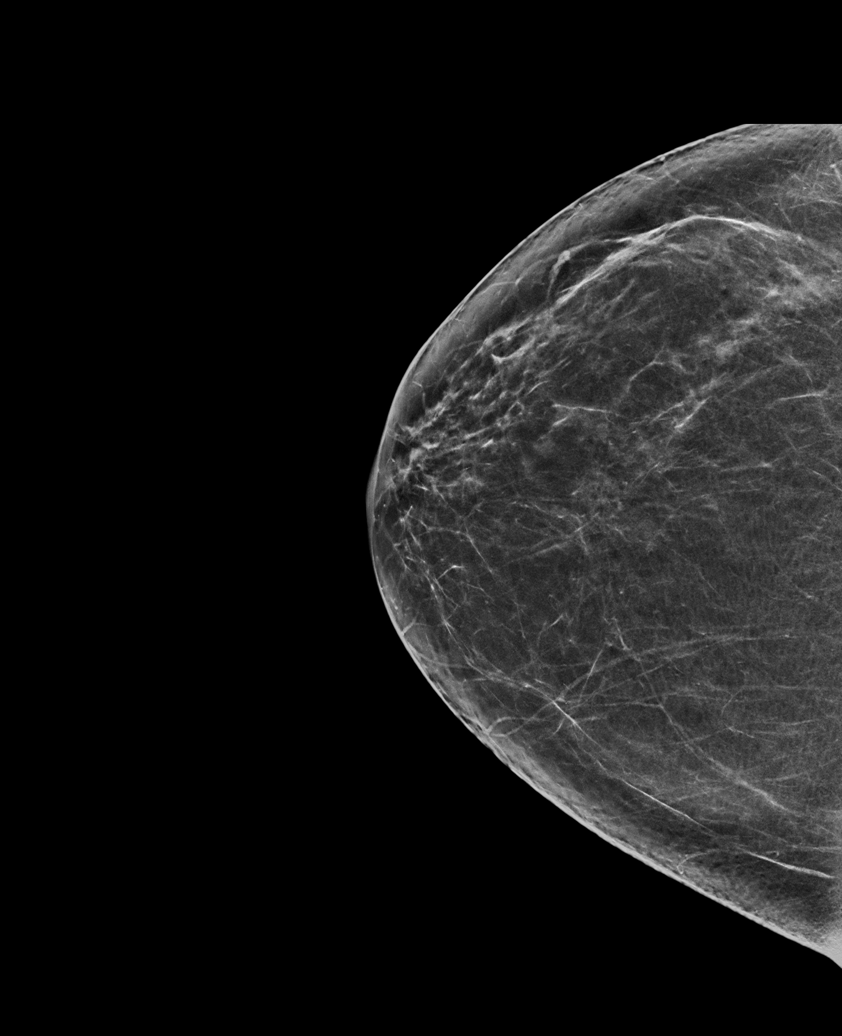

[6 of 36 positions shown; findings below may reference images not displayed]

ACR Breast Density Category b: There are scattered areas of
fibroglandular density.
FINDINGS: There are no findings suspicious for malignancy. Images were
processed with CAD.
IMPRESSION: No mammographic evidence of malignancy. A result letter of this
screening mammogram will be mailed directly to the patient.

RECOMMENDATION:
Screening mammogram in one year. (Code:[id number]-775)

BI-RADS CATEGORY  1: Negative.

## 2020-11-26 ENCOUNTER — Other Ambulatory Visit: Payer: Self-pay | Admitting: Obstetrics & Gynecology

## 2020-11-26 DIAGNOSIS — Z1231 Encounter for screening mammogram for malignant neoplasm of breast: Secondary | ICD-10-CM

## 2020-12-08 ENCOUNTER — Other Ambulatory Visit: Payer: Self-pay

## 2020-12-08 ENCOUNTER — Ambulatory Visit (INDEPENDENT_AMBULATORY_CARE_PROVIDER_SITE_OTHER): Payer: 59 | Admitting: Obstetrics & Gynecology

## 2020-12-08 ENCOUNTER — Encounter: Payer: Self-pay | Admitting: Obstetrics & Gynecology

## 2020-12-08 VITALS — BP 128/80 | Ht 63.0 in | Wt 219.0 lb

## 2020-12-08 DIAGNOSIS — Z1211 Encounter for screening for malignant neoplasm of colon: Secondary | ICD-10-CM

## 2020-12-08 DIAGNOSIS — Z01419 Encounter for gynecological examination (general) (routine) without abnormal findings: Secondary | ICD-10-CM

## 2020-12-08 DIAGNOSIS — Z780 Asymptomatic menopausal state: Secondary | ICD-10-CM

## 2020-12-08 MED ORDER — CLONIDINE HCL 0.1 MG PO TABS: 0.1000 mg | ORAL_TABLET | Freq: Every day | ORAL | 3 refills | Status: AC

## 2020-12-08 NOTE — Progress Notes (Signed)
HPI:      [name] is a 50 y.o. G1P1 who LMP was in the past, she presents today for her annual examination.  The patient has no complaints today, other than continued if not worsening HOT FLASHES and NIGHT SWEATS as well as INSOMNIA. OTC meds have not helped.  Exercise and diet, no help, and also has had weight gain not loss. The patient has not had a period in 1 1/2 years; she is sexually active. Herlast pap: approximate date 2020 and was normal and last mammogram: approximate date 2021 and was normal.  The patient does perform self breast exams.  There is no notable family history of breast or ovarian cancer in her family. The patient is not taking hormone replacement therapy. Patient denies post-menopausal vaginal bleeding.   The patient has regular exercise: yes. The patient denies current symptoms of depression.    GYN Hx: Last Colonoscopy:not done yet   PMHx: Past Medical History:  Diagnosis Date  . Abnormal mammogram, unspecified 2013   LEFT BREAST  . Acid reflux   . Lupus ([facility])    skin   Past Surgical History:  Procedure Laterality Date  . BREAST BIOPSY Left 05/2012   bx neg  . BREAST SURGERY Left 2013   left breast core biopsy  . COLONOSCOPY  AGE 51  . skull fx   1984   FRACTURE  . TONSILLECTOMY  AGE 80   Family History  Problem Relation Age of Onset  . Lymphoma Mother 4  . Hypertension Mother   . Diabetes Father   . CAD Father   . Hypertension Father   . Hypertension Sister   . Hypertension Brother   . Breast cancer Neg Hx   . Ovarian cancer Neg Hx    Social History   Tobacco Use  . Smoking status: Current Every Day Smoker    Packs/day: 1.00    Years: 20.00    Pack years: 20.00  . Smokeless tobacco: Never Used  Substance Use Topics  . Alcohol use: Yes  . Drug use: No    Current Outpatient Medications:  .  acyclovir (ZOVIRAX) 400 MG tablet, take 1 tablet by mouth three times a day AS NEEDED FOR ONE WEEK FOR COLD SORES, Disp: , Rfl:  .   cloNIDine (CATAPRES) 0.1 MG tablet, Take 1 tablet (0.1 mg total) by mouth daily., Disp: 90 tablet, Rfl: 3 .  pantoprazole (PROTONIX) 40 MG tablet, Take by mouth., Disp: , Rfl:  .  acyclovir (ZOVIRAX) 400 MG tablet, , Disp: , Rfl: 0 .  cetirizine (ZYRTEC) 10 MG tablet, Take by mouth., Disp: , Rfl:  .  cyclobenzaprine (FLEXERIL) 5 MG tablet, Take 1 tablet (5 mg total) by mouth 3 (three) times daily as needed for muscle spasms. (Patient not taking: No sig reported), Disp: 30 tablet, Rfl: 0 .  escitalopram (LEXAPRO) 10 MG tablet, Take by mouth., Disp: , Rfl:  .  fluticasone (FLONASE) 50 MCG/ACT nasal spray, Place into the nose., Disp: , Rfl:  .  meclizine (ANTIVERT) 12.5 MG tablet, Take 1 tablet (12.5 mg total) by mouth 3 (three) times daily as needed for dizziness or nausea. (Patient not taking: No sig reported), Disp: 30 tablet, Rfl: 1 .  meloxicam (MOBIC) 15 MG tablet, Take 1 tablet (15 mg total) by mouth daily. (Patient not taking: No sig reported), Disp: 30 tablet, Rfl: 1 .  methylPREDNISolone (MEDROL DOSEPAK) 4 MG TBPK tablet, 6 day dose pack - take as directed (Patient  not taking: No sig reported), Disp: 21 tablet, Rfl: 0 .  naproxen (NAPROSYN) 500 MG tablet, Take by mouth. (Patient not taking: Reported on 12/08/2020), Disp: , Rfl:  .  omeprazole (PRILOSEC) 20 MG capsule, Take 20 mg by mouth daily. (Patient not taking: Reported on 12/08/2020), Disp: , Rfl:  .  ondansetron (ZOFRAN ODT) 4 MG disintegrating tablet, Take 1 tablet (4 mg total) by mouth every 6 (six) hours as needed for nausea or vomiting. (Patient not taking: No sig reported), Disp: 20 tablet, Rfl: 0 Allergies: Amoxicillin  Review of Systems  Constitutional: Positive for malaise/fatigue. Negative for chills and fever.  HENT: Negative for congestion, sinus pain and sore throat.   Eyes: Negative for blurred vision and pain.  Respiratory: Positive for cough. Negative for wheezing.   Cardiovascular: Negative for chest pain and leg  swelling.  Gastrointestinal: Negative for abdominal pain, constipation, diarrhea, heartburn, nausea and vomiting.  Genitourinary: Negative for dysuria, frequency, hematuria and urgency.  Musculoskeletal: Positive for joint pain. Negative for back pain, myalgias and neck pain.  Skin: Negative for itching and rash.  Neurological: Negative for dizziness, tremors and weakness.  Endo/Heme/Allergies: Does not bruise/bleed easily.  Psychiatric/Behavioral: Negative for depression. The patient is nervous/anxious. The patient does not have insomnia.     Objective: BP 128/80   Ht 5\' 3"  (1.6 m)   Wt 219 lb (99.3 kg)   BMI 38.79 kg/m   Filed Weights   12/08/20 1432  Weight: 219 lb (99.3 kg)   Body mass index is 38.79 kg/m. Physical Exam Constitutional:      General: She is not in acute distress.    Appearance: She is well-developed.  Genitourinary:     Rectum normal.     No lesions in the vagina.     No vaginal bleeding.      Right Adnexa: not tender and no mass present.    Left Adnexa: not tender and no mass present.    No cervical motion tenderness, friability, lesion or polyp.     Uterus is not enlarged.     No uterine mass detected. Breasts:     Right: No mass, skin change or tenderness.     Left: No mass, skin change or tenderness.    HENT:     Head: Normocephalic and atraumatic. No laceration.     Right Ear: Hearing normal.     Left Ear: Hearing normal.     Mouth/Throat:     Pharynx: Uvula midline.  Eyes:     Pupils: Pupils are equal, round, and reactive to light.  Neck:     Thyroid: No thyromegaly.  Cardiovascular:     Rate and Rhythm: Normal rate and regular rhythm.     Heart sounds: No murmur heard. No friction rub. No gallop.   Pulmonary:     Effort: Pulmonary effort is normal. No respiratory distress.     Breath sounds: Normal breath sounds. No wheezing.  Abdominal:     General: Bowel sounds are normal. There is no distension.     Palpations: Abdomen is soft.      Tenderness: There is no abdominal tenderness. There is no rebound.  Musculoskeletal:        General: Normal range of motion.     Cervical back: Normal range of motion and neck supple.  Neurological:     Mental Status: She is alert and oriented to person, place, and time.     Cranial Nerves: No cranial nerve deficit.  Skin:  General: Skin is warm and dry.  Psychiatric:        Judgment: Judgment normal.  Vitals reviewed.     Assessment: Annual Exam 1. Women's annual routine gynecological examination   2. Menopause   3. Screen for colon cancer     Plan:            1.  Cervical Screening-  Pap smear done today  2. Breast screening- Exam annually and mammogram scheduled soon  3. Colonoscopy every 10 years, Hemoccult testing after age 41.  Referral placed, plans to schedule soon.  4. Labs managed by PCP  5. Counseling for hormonal therapy: none, pros and cons of HRT for menopause sx's discussed, also alternative options. Clonidine as non-hormonal option discussed. Rx. Info given.     F/U  Return in about 2 months (around 02/07/2021) for Follow up Virtual.  Annamarie Major, MD, [facility] 12/08/2020  2:59 PM

## 2020-12-08 NOTE — Patient Instructions (Addendum)
PAP every three years Mammogram every year    Call [phone] to schedule at [facility] Colonoscopy every 10 years    Someone to call soon Labs yearly (with PCP)  Thank you for choosing [facility]. As part of our ongoing efforts to improve patient experience, we would appreciate your feedback. Please fill out the short survey that you will receive by mail or MyChart. Your opinion is important to [place]! - Dr. Tiburcio Pea  Clonidine Oral Tablets What is this medicine? CLONIDINE (KLOE ni deen) is an alpha agonist. It treats high blood pressure. It is also used for menopause symptoms. This medicine may be used for other purposes; ask your health care provider or pharmacist if you have questions. COMMON BRAND NAME(S): Catapres What should I tell my health care provider before I take this medicine? They need to know if you have any of these conditions:  kidney disease  an unusual or allergic reaction to clonidine, other medicines, foods, dyes, or preservatives  pregnant or trying to get pregnant  breast-feeding How should I use this medicine? Take this drug by mouth. Take it as directed on the prescription label at the same time every day. You can take it with or without food. If it upsets your stomach, take it with food. Keep taking it unless your health care provider tells you to stop. Talk to your health care provider about the use of this drug in children. Special care may be needed. Overdosage: If you think you have taken too much of this medicine contact a poison control center or emergency room at once. NOTE: This medicine is only for you. Do not share this medicine with others. What if I miss a dose? If you miss a dose, take it as soon as you can. If it is almost time for your next dose, take only that dose. Do not take double or extra doses. What may interact with this medicine? Do not take this medicine with any of the following medications:  MAOIs like Carbex, Eldepryl, Marplan,  Nardil, and Parnate This medicine may also interact with the following medications:  barbiturate medicines for inducing sleep or treating seizures like phenobarbital  certain medicines for blood pressure, heart disease, irregular heart beat  certain medicines for depression, anxiety, or psychotic disturbances  prescription pain medicines This list may not describe all possible interactions. Give your health care provider a list of all the medicines, herbs, non-prescription drugs, or dietary supplements you use. Also tell them if you smoke, drink alcohol, or use illegal drugs. Some items may interact with your medicine. What should I watch for while using this medicine? Visit your doctor or health care professional for regular checks on your progress. Check your heart rate and blood pressure regularly while you are taking this medicine. Ask your doctor or health care professional what your heart rate should be and when you should contact him or her. You may get drowsy or dizzy. Do not drive, use machinery, or do anything that needs mental alertness until you know how this medicine affects you. To avoid dizzy or fainting spells, do not stand or sit up quickly, especially if you are an older person. Alcohol can make you more drowsy and dizzy. Avoid alcoholic drinks. Your mouth may get dry. Chewing sugarless gum or sucking hard candy, and drinking plenty of water will help. Do not treat yourself for coughs, colds, or pain while you are taking this medicine without asking your doctor or health care professional for advice. Some  ingredients may increase your blood pressure. If you are going to have surgery tell your doctor or health care professional that you are taking this medicine. What side effects may I notice from receiving this medicine? Side effects that you should report to your doctor or health care professional as soon as possible:  allergic reactions like skin rash, itching or hives,  swelling of the face, lips, or tongue  anxiety, nervousness  chest pain  depression  fast, irregular heartbeat  swelling of feet or legs  unusually weak or tired Side effects that usually do not require medical attention (report to your doctor or health care professional if they continue or are bothersome):  change in sex drive or performance  constipation  headache This list may not describe all possible side effects. Call your doctor for medical advice about side effects. You may report side effects to FDA at 1-800-FDA-1088. Where should I keep my medicine? Keep out of the reach of children and pets. Store at room temperature between 15 and 30 degrees C (59 and 86 degrees F). Throw away any unused drug after the expiration date. NOTE: This sheet is a summary. It may not cover all possible information. If you have questions about this medicine, talk to your doctor, pharmacist, or health care provider.  2021 Elsevier/Gold Standard (2019-04-30 16:38:11)

## 2020-12-17 ENCOUNTER — Ambulatory Visit
Admission: RE | Admit: 2020-12-17 | Discharge: 2020-12-17 | Disposition: A | Discharge status: Home / Self Care | Payer: 59 | Source: Ambulatory Visit | Attending: Obstetrics & Gynecology | Admitting: Obstetrics & Gynecology

## 2020-12-17 ENCOUNTER — Other Ambulatory Visit: Payer: Self-pay

## 2020-12-17 DIAGNOSIS — Z1231 Encounter for screening mammogram for malignant neoplasm of breast: Secondary | ICD-10-CM | POA: Insufficient documentation

## 2020-12-21 ENCOUNTER — Other Ambulatory Visit: Payer: Self-pay | Admitting: Obstetrics & Gynecology

## 2020-12-21 DIAGNOSIS — R928 Other abnormal and inconclusive findings on diagnostic imaging of breast: Secondary | ICD-10-CM

## 2020-12-22 ENCOUNTER — Other Ambulatory Visit: Payer: Self-pay | Admitting: Obstetrics & Gynecology

## 2020-12-22 DIAGNOSIS — R928 Other abnormal and inconclusive findings on diagnostic imaging of breast: Secondary | ICD-10-CM

## 2020-12-24 ENCOUNTER — Encounter: Payer: Self-pay | Admitting: Obstetrics & Gynecology

## 2020-12-24 ENCOUNTER — Encounter: Payer: Self-pay | Admitting: *Deleted

## 2020-12-24 NOTE — Progress Notes (Signed)
OK to order (done) and schedule

## 2020-12-30 ENCOUNTER — Ambulatory Visit
Admission: RE | Admit: 2020-12-30 | Discharge: 2020-12-30 | Disposition: A | Discharge status: Home / Self Care | Payer: 59 | Source: Ambulatory Visit | Attending: Obstetrics & Gynecology | Admitting: Obstetrics & Gynecology

## 2020-12-30 ENCOUNTER — Other Ambulatory Visit: Payer: Self-pay

## 2020-12-30 DIAGNOSIS — R928 Other abnormal and inconclusive findings on diagnostic imaging of breast: Secondary | ICD-10-CM | POA: Diagnosis not present

## 2021-01-19 ENCOUNTER — Telehealth: Payer: Self-pay | Admitting: Obstetrics & Gynecology

## 2021-01-19 NOTE — Telephone Encounter (Signed)
Pt. Saw you in March, was prescribed medication, but it is not effective for her.  She would like to try the other option that you mentioned.

## 2021-01-21 ENCOUNTER — Other Ambulatory Visit: Payer: Self-pay | Admitting: Obstetrics & Gynecology

## 2021-01-21 NOTE — Telephone Encounter (Signed)
Sch tele appt today (work in) if possible to discuss

## 2021-12-06 ENCOUNTER — Other Ambulatory Visit: Payer: Self-pay | Admitting: Obstetrics & Gynecology

## 2021-12-06 DIAGNOSIS — Z1231 Encounter for screening mammogram for malignant neoplasm of breast: Secondary | ICD-10-CM

## 2022-01-03 ENCOUNTER — Ambulatory Visit: Payer: 59 | Admitting: Licensed Practical Nurse

## 2022-01-05 ENCOUNTER — Other Ambulatory Visit (HOSPITAL_COMMUNITY)
Admission: RE | Admit: 2022-01-05 | Discharge: 2022-01-05 | Disposition: A | Discharge status: Home / Self Care | Payer: BC Managed Care – PPO | Source: Ambulatory Visit | Attending: Licensed Practical Nurse | Admitting: Licensed Practical Nurse

## 2022-01-05 ENCOUNTER — Encounter: Payer: Self-pay | Admitting: Licensed Practical Nurse

## 2022-01-05 ENCOUNTER — Ambulatory Visit (INDEPENDENT_AMBULATORY_CARE_PROVIDER_SITE_OTHER): Payer: BC Managed Care – PPO | Admitting: Licensed Practical Nurse

## 2022-01-05 VITALS — BP 120/62 | Ht 63.0 in | Wt 201.0 lb

## 2022-01-05 DIAGNOSIS — R5383 Other fatigue: Secondary | ICD-10-CM | POA: Diagnosis not present

## 2022-01-05 DIAGNOSIS — N941 Unspecified dyspareunia: Secondary | ICD-10-CM

## 2022-01-05 DIAGNOSIS — Z124 Encounter for screening for malignant neoplasm of cervix: Secondary | ICD-10-CM

## 2022-01-05 DIAGNOSIS — Z1211 Encounter for screening for malignant neoplasm of colon: Secondary | ICD-10-CM

## 2022-01-05 DIAGNOSIS — Z01419 Encounter for gynecological examination (general) (routine) without abnormal findings: Secondary | ICD-10-CM | POA: Insufficient documentation

## 2022-01-05 MED ORDER — PREMARIN 0.625 MG/GM VA CREA: TOPICAL_CREAM | VAGINAL | 12 refills | Status: AC

## 2022-01-05 NOTE — Progress Notes (Signed)
? ? ? ?Gynecology Annual Exam  ?PCP: Kandyce RudBabaoff, Marcus, MD ? ?Chief Complaint:  ?Chief Complaint  ?Patient presents with  ? Gynecologic Exam  ? ? ?History of Present Illness:Patient is a 51 y.o. G1P1 presents for annual exam. The patient has no complaints today.  ? ?LMP: Patient's last menstrual period was 03/04/2019. ? ? ?The patient is sexually active. She does have  dyspareunia.  The patient does perform self breast exams.  There is no notable family history of breast or ovarian cancer in her family. ? ?The patient wears seatbelts: yes.   The patient has regular exercise: yes.  Goes to the gym 3 times a week. Has lost 20-30 pounds from exercise and making changes to her diet.  ? ?The patient denies current symptoms of depression.   \Works in [employer], works form home mostly ?Lives with her husband, feels safe ?Describes stress as "some days are worse than the others" goes to the gym to decrease stress.  ?Last dental exam: yesterday ?[facility] ? ? ?Review of Systems: Review of Systems  ?Constitutional:  Positive for malaise/fatigue.  ?Respiratory: Negative.    ?Cardiovascular: Negative.   ?Gastrointestinal: Negative.   ?Genitourinary: Negative.   ?Endo/Heme/Allergies: Negative.   ?Psychiatric/Behavioral: Negative.    ? ?Past Medical History:  ?Patient Active Problem List  ? Diagnosis Date Noted  ? 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  Last Assessment & Plan:  ?Normal FANA/C3/C4/urine analysis/ ? ?  ? Lymphedema of both lower extremities 06/21/2018  ? Rash, skin 06/21/2018  ?  FOREHEAD ?NON SPECIFIC BIOPSY 06/08/2018: Parakeratosis, follicular , plugging, epidermal atrophy, thickening of the basement membrane and liquefaction degeneration of the basal layer.  In addition there is a  superficial and  Deep perivascular and periadnexal inflammatory infiltrate composed of lymphocytes and histiocytes ? ?  ? Abnormal perimenopausal bleeding 07/14/2017  ? Screening for breast cancer 05/01/2017  ? Menopause 05/01/2017  ? Hip pain  06/20/2015  ? Abnormal mammogram, unspecified   ? GERD (gastroesophageal reflux disease) 10/08/2012  ? Oral herpes 10/08/2012  ? Tobacco use 10/08/2012  ? ? ?Past Surgical History:  ?Past Surgical History:  ?Procedure Laterality Date  ? BREAST BIOPSY Left 05/2012  ? bx neg  ? BREAST SURGERY Left 2013  ? left breast core biopsy  ? COLONOSCOPY  AGE 69  ? skull fx   1984  ? FRACTURE  ? TONSILLECTOMY  AGE 51  ? ? ?Gynecologic History:  ?Patient's last menstrual period was 03/04/2019. ?Last Pap: Results were: 11/2018 no abnormalities  ?Last mammogram: 12/2020 Results were:  no evidence of malignancy  ? ?Obstetric History: G1P1 ? ?Family History:  ?Family History  ?Problem Relation Age of Onset  ? Lymphoma Mother 141  ? Hypertension Mother   ? Diabetes Father   ? CAD Father   ? Hypertension Father   ? Hypertension Sister   ? Hypertension Brother   ? Breast cancer Neg Hx   ? Ovarian cancer Neg Hx   ? ? ?Social History:  ?Social History  ? ?Socioeconomic History  ? Marital status: Married  ?  Spouse name: Not on file  ? Number of children: Not on file  ? Years of education: Not on file  ? Highest education level: Not on file  ?Occupational History  ? Not on file  ?Tobacco Use  ? Smoking status: Every Day  ?  Packs/day: 1.00  ?  Years: 20.00  ?  Pack years: 20.00  ?  Types: Cigarettes  ? Smokeless tobacco:  Never  ?Substance and Sexual Activity  ? Alcohol use: Yes  ? Drug use: No  ? Sexual activity: Yes  ?  Birth control/protection: None  ?Other Topics Concern  ? Not on file  ?Social History Narrative  ? Not on file  ? ?Social Determinants of Health  ? ?Financial Resource Strain: Not on file  ?Food Insecurity: Not on file  ?Transportation Needs: Not on file  ?Physical Activity: Not on file  ?Stress: Not on file  ?Social Connections: Not on file  ?Intimate Partner Violence: Not on file  ? ? ?Allergies:  ?Allergies  ?Allergen Reactions  ? Amoxicillin Other (See Comments)  ?  Causes really bad yeast infections   ? ? ?Medications: ?Prior to Admission medications   ?Medication Sig Start Date End Date Taking? Authorizing Provider  ?acyclovir (ZOVIRAX) 400 MG tablet  02/13/17  Yes [provider]  ?acyclovir (ZOVIRAX) 400 MG tablet take 1 tablet by mouth three times a day AS NEEDED FOR ONE WEEK FOR COLD SORES 08/23/16  Yes [provider]  ?conjugated estrogens (PREMARIN) vaginal cream 0.5 grams per vagina twice a week 01/05/22  Yes Lonette Stevison, Courtney Heys, CNM  ?cetirizine (ZYRTEC) 10 MG tablet Take by mouth. 06/24/16 06/24/17  [provider]  ?cloNIDine (CATAPRES) 0.1 MG tablet Take 1 tablet (0.1 mg total) by mouth daily. ?Patient not taking: Reported on 01/05/2022 12/08/20   Nadara Mustard, MD  ?escitalopram Judye Bos) 10 MG tablet Take by mouth. 04/25/19 07/24/19  [provider]  ?fluticasone (FLONASE) 50 MCG/ACT nasal spray Place into the nose. 06/24/16 06/24/17  [provider]  ? ? ?Physical Exam ?Vitals: Blood pressure 120/62, height 5\' 3"  (1.6 m), weight 201 lb (91.2 kg), last menstrual period 03/04/2019. ? ?General: NAD ?HEENT: normocephalic, anicteric ?Thyroid: no enlargement, no palpable nodules ?Pulmonary: No increased work of breathing, CTAB ?Cardiovascular: RRR, distal pulses 2+ ?Breast: Breast symmetrical, no tenderness, no palpable nodules or masses, no skin or nipple retraction present, no nipple discharge.  No axillary or supraclavicular lymphadenopathy. ?Abdomen: NABS, soft, non-tender, non-distended.  Umbilicus without lesions.  No hepatomegaly, splenomegaly or masses palpable. No evidence of hernia  ?Genitourinary: ? External: Normal external female genitalia.  Normal urethral meatus, normal Bartholin's and Skene's glands.   ? Vagina: Normal vaginal mucosa, no evidence of prolapse.   ? Cervix: Grossly normal in appearance, no bleeding ? Uterus: Non-enlarged, mobile, normal contour.  No CMT ? Adnexa: ovaries non-enlarged, no adnexal masses ? Rectal:  deferred ? Lymphatic: no evidence of inguinal lymphadenopathy ?Extremities: no edema, erythema, or tenderness ?Neurologic: Grossly intact ?Psychiatric: mood appropriate, affect full ? ? ? ?Assessment: 51 y.o. G1P1 routine annual exam ? ?Plan: ?Problem List Items Addressed This Visit   ?None ?Visit Diagnoses   ? ? Well woman exam    -  Primary  ? Relevant Orders  ? TSH + free T4  ? CBC  ? Cytology - PAP  ? Ambulatory referral to Gastroenterology  ? Dyspareunia in female      ? Relevant Medications  ? conjugated estrogens (PREMARIN) vaginal cream  ? Other fatigue      ? Relevant Orders  ? TSH + free T4  ? CBC  ? Cervical cancer screening      ? Relevant Orders  ? Cytology - PAP  ? Ambulatory referral to Gastroenterology  ? Colon cancer screening      ? ?  ? ? ?1) Mammogram - recommend yearly screening mammogram.  Mammogram  scheduled for this month  ? ?  2) STI screening  wasoffered and declined ? ?3) ASCCP guidelines and rational discussed.  Patient opts for every 5 years screening interval ? ?4) Osteoporosis  ?- per USPTF routine screening DEXA at age 65 ?- FRAX 10 year major fracture risk not assessed  ? ?Consider FDA-approved medical therapies in postmenopausal women and men aged 24 years and older, based on the following: ?a) A hip or vertebral (clinical or morphometric) fracture ?b) T-score ? -2.5 at the femoral neck or spine after appropriate evaluation to exclude secondary causes ?C) Low bone mass (T-score between -1.0 and -2.5 at the femoral neck or spine) and a 10-year probability of a hip fracture ? 3% or a 10-year probability of a major osteoporosis-related fracture ? 20% based on the [place]-adapted WHO algorithm ? ? ?5) Routine healthcare maintenance including cholesterol, diabetes screening discussed managed by PCP ? ?6) Colonoscopy ordered .  Screening recommended starting at age 76 for average risk individuals, age 54 for individuals deemed at increased risk (including African Americans) and recommended to  continue until age 70.  For patient age 32-85 individualized approach is recommended.  Gold standard screening is via colonoscopy, Cologuard screening is an acceptable alternative for patient unwilling or unable to undergo colonoscopy.  "[place]

## 2022-01-06 ENCOUNTER — Telehealth: Payer: Self-pay

## 2022-01-06 LAB — TSH+FREE T4
Free T4: 1.18 ng/dL (ref 0.82–1.77)
TSH: 2.36 u[IU]/mL (ref 0.450–4.500)

## 2022-01-06 LAB — CBC
Hematocrit: 42.3 % (ref 34.0–46.6)
Hemoglobin: 14.4 g/dL (ref 11.1–15.9)
MCH: 31.1 pg (ref 26.6–33.0)
MCHC: 34 g/dL (ref 31.5–35.7)
MCV: 91 fL (ref 79–97)
Platelets: 233 10*3/uL (ref 150–450)
RBC: 4.63 x10E6/uL (ref 3.77–5.28)
RDW: 12.8 % (ref 11.7–15.4)
WBC: 5.9 10*3/uL (ref 3.4–10.8)

## 2022-01-06 NOTE — Telephone Encounter (Signed)
CALLED PATIENT NO ANSWER LEFT VOICEMAIL FOR A CALL BACK °Letter sent °

## 2022-01-07 ENCOUNTER — Encounter: Payer: Self-pay | Admitting: Licensed Practical Nurse

## 2022-01-07 LAB — CYTOLOGY - PAP
Chlamydia: NEGATIVE
Comment: NEGATIVE
Comment: NEGATIVE
Comment: NEGATIVE
Comment: NORMAL
Diagnosis: NEGATIVE
High risk HPV: NEGATIVE
Neisseria Gonorrhea: NEGATIVE
Trichomonas: NEGATIVE

## 2022-01-26 ENCOUNTER — Ambulatory Visit
Admission: RE | Admit: 2022-01-26 | Discharge: 2022-01-26 | Disposition: A | Discharge status: Home / Self Care | Payer: BC Managed Care – PPO | Source: Ambulatory Visit | Attending: Obstetrics & Gynecology | Admitting: Obstetrics & Gynecology

## 2022-01-26 ENCOUNTER — Other Ambulatory Visit: Payer: Self-pay | Admitting: Licensed Practical Nurse

## 2022-01-26 DIAGNOSIS — Z1231 Encounter for screening mammogram for malignant neoplasm of breast: Secondary | ICD-10-CM

## 2023-04-25 ENCOUNTER — Other Ambulatory Visit: Payer: Self-pay | Admitting: Licensed Practical Nurse

## 2023-04-25 DIAGNOSIS — Z1231 Encounter for screening mammogram for malignant neoplasm of breast: Secondary | ICD-10-CM

## 2023-05-03 ENCOUNTER — Ambulatory Visit: Admission: RE | Admit: 2023-05-03 | Payer: 59 | Source: Ambulatory Visit

## 2023-05-03 DIAGNOSIS — Z1231 Encounter for screening mammogram for malignant neoplasm of breast: Secondary | ICD-10-CM

## 2023-05-05 ENCOUNTER — Other Ambulatory Visit: Payer: Self-pay | Admitting: Licensed Practical Nurse

## 2023-05-05 DIAGNOSIS — R928 Other abnormal and inconclusive findings on diagnostic imaging of breast: Secondary | ICD-10-CM

## 2023-05-11 ENCOUNTER — Ambulatory Visit: Payer: 59 | Admitting: Licensed Practical Nurse

## 2023-05-11 ENCOUNTER — Other Ambulatory Visit (HOSPITAL_COMMUNITY)
Admission: RE | Admit: 2023-05-11 | Discharge: 2023-05-11 | Disposition: A | Discharge status: Home / Self Care | Payer: 59 | Source: Ambulatory Visit | Attending: Licensed Practical Nurse | Admitting: Licensed Practical Nurse

## 2023-05-11 VITALS — BP 130/84 | HR 68 | Ht 63.0 in | Wt 210.2 lb

## 2023-05-11 DIAGNOSIS — Z113 Encounter for screening for infections with a predominantly sexual mode of transmission: Secondary | ICD-10-CM

## 2023-05-11 DIAGNOSIS — Z01419 Encounter for gynecological examination (general) (routine) without abnormal findings: Secondary | ICD-10-CM

## 2023-05-11 DIAGNOSIS — F419 Anxiety disorder, unspecified: Secondary | ICD-10-CM

## 2023-05-11 NOTE — Progress Notes (Signed)
Gynecology Annual Exam  PCP: Kandyce Rud, MD  Chief Complaint: No chief complaint on file.   History of Present Illness:Pamela Mccormick is a 52 y.o. G1P1 presents for annual exam. The Pamela Mccormick has no complaints today. Has menopausal symptoms, hot flashes, night sweats, vaginal dryness-these symptoms can be bothersome but seem to not occur as often any more.  -she does get a pain on her lower right side from time, it has occurred 3 time sin the last 6 months, it will last about half a day. Declines pelvic US.   LMP: Pamela Mccormick's last menstrual period was 03/04/2019.   The Pamela Mccormick is sexually active. She has dyspareunia-uses lubricant, Premarin cream is too expensive.  The Pamela Mccormick does perform self breast exams.  There is no notable family history of breast or ovarian cancer in her family.  The Pamela Mccormick wears seatbelts: yes.   The Pamela Mccormick has regular exercise:  Mose has been under a lot of stress so has fallen out of her routine, has been binge eating d/the stress and has gained weight. She is aware that she needs to exercise.  Camelia Eng lost her stepson age 59 in March this has been devastating for her and her partner. Estha's father has been ill, requiring  a lot of attention.   The Pamela Mccormick denies current symptoms of depression.   Pt admits she has anxiety, she has been having panic attacks. She has seen her PCP about this, she was prescribed Lexapro but has not started it- she "does not like to take medications" she does use Lorazepam PRN, which helps. She has not seen a therapist. The anxiety is disrupting her sleep and daily life.    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  In addition there is a  superficial and  Deep perivascular and periadnexal inflammatory infiltrate composed of lymphocytes and histiocytes    Abnormal perimenopausal bleeding 07/14/2017    Screening for breast cancer 05/01/2017    Menopause 05/01/2017    Hip pain 06/20/2015    Abnormal mammogram, unspecified     GERD (gastroesophageal reflux disease) 10/08/2012    Oral herpes 10/08/2012    Tobacco use 10/08/2012     Past Surgical History:  Past Surgical History:  Procedure Laterality Date   BREAST BIOPSY Left 05/2012   bx neg   BREAST SURGERY Left 2013   left breast core biopsy   COLONOSCOPY  AGE 89   skull fx   1984   FRACTURE   TONSILLECTOMY  AGE 38    Gynecologic History:  Pamela Mccormick's last menstrual period was 03/04/2019. Last Pap: Results were: 2023 no abnormalities  Last mammogram: 04/2023 Results were: needs additional imaging for possible distortion of the Right breast   Obstetric History: G1P1  Family History:  Family History  Problem Relation Age of  Onset   Lymphoma Mother 43   Hypertension Mother    Diabetes Father    CAD Father    Hypertension Father    Hypertension Sister    Hypertension Brother    Breast cancer Neg Hx    Ovarian cancer Neg Hx     Social History:  Social History   Socioeconomic History   Marital status: Married    Spouse name: Not on file   Number of children: Not on file   Years of education: Not on file   Highest education level: Not on file  Occupational History   Not on file  Tobacco Use   Smoking status: Every Day    Current packs/day: 1.00    Average packs/day: 1 pack/day for 20.0 years (20.0 ttl  pk-yrs)    Types: Cigarettes   Smokeless tobacco: Never  Substance and Sexual Activity   Alcohol use: Yes   Drug use: No   Sexual activity: Yes    Birth control/protection: None  Other Topics Concern   Not on file  Social History Narrative   Not on file   Social Determinants of Health   Financial Resource Strain: Not on file  Food Insecurity: Not on file  Transportation Needs: Not on file  Physical Activity: Not on file  Stress: Not on file  Social Connections: Not on file  Intimate Partner Violence: Not on file    Allergies:  Allergies  Allergen Reactions   Amoxicillin Other (See Comments)    Causes really bad yeast infections    Medications: Prior to Admission medications   Medication Sig Start Date End Date Taking? Authorizing Provider  conjugated estrogens (PREMARIN) vaginal cream 0.5 grams per vagina twice a week 01/05/22  Yes Mae Cianci, Courtney Heys, CNM  acyclovir (ZOVIRAX) 400 MG tablet  02/13/17   [provider]  acyclovir (ZOVIRAX) 400 MG tablet take 1 tablet by mouth three times a day AS NEEDED FOR ONE WEEK FOR COLD SORES 08/23/16   [provider]  cetirizine (ZYRTEC) 10 MG tablet Take by mouth. 06/24/16 06/24/17  [provider]  cloNIDine (CATAPRES) 0.1 MG tablet Take 1 tablet (0.1 mg total) by mouth daily. Pamela Mccormick not taking: Reported on 01/05/2022 12/08/20   Nadara Mustard, MD  escitalopram Judye Bos) 10 MG tablet Take by mouth. 04/25/19 07/24/19  [provider]  fluticasone (FLONASE) 50 MCG/ACT nasal spray Place into the nose. 06/24/16 06/24/17  [provider]    Physical Exam Vitals: Blood pressure 130/84, pulse 68, height 5\' 3"  (1.6 m), weight 210 lb 3.2 oz (95.3 kg), last menstrual period 03/04/2019.  General: NAD HEENT: normocephalic, anicteric Thyroid: no enlargement, no palpable nodules Pulmonary: No increased work of breathing, CTAB Cardiovascular: RRR, distal pulses 2+ Breast: Breast symmetrical, no  tenderness, no palpable nodules or masses, no skin or nipple retraction present, no nipple discharge.  No axillary or supraclavicular lymphadenopathy. Abdomen: NABS, soft, non-tender, non-distended.  Umbilicus without lesions.  No hepatomegaly, splenomegaly or masses palpable. No evidence of hernia  Genitourinary:  External: Normal external female genitalia-slightly atrophic.  Normal urethral meatus, normal Bartholin's and Skene's glands.    Vagina: Normal vaginal mucosa, no evidence of prolapse.    Cervix: Grossly normal in appearance, no bleeding  Uterus: Non-enlarged, mobile, normal contour.  No CMT  Adnexa: ovaries non-enlarged, no adnexal masses  Rectal: deferred  Lymphatic: no evidence of inguinal lymphadenopathy Extremities: no edema, erythema, or tenderness Neurologic: Grossly intact Psychiatric: mood appropriate, affect full  Female chaperone present for  pelvic and breast  portions of the physical exam     Assessment: 52 y.o. G1P1 routine annual exam  Plan: Problem List Items Addressed This Visit   None Visit Diagnoses     Well woman exam    -  Primary   Relevant Orders   HEP, RPR, HIV Panel   Hepatitis C antibody   Cervicovaginal ancillary only   Ambulatory referral to Gastroenterology   Screening examination for venereal disease       Relevant Orders   HEP, RPR, HIV Panel   Hepatitis C antibody   Cervicovaginal ancillary only   Anxiety           1) Mammogram - recommend yearly screening mammogram.  Mammogram Is up to date. Has been contacted to schedule additional imaging.   2) STI screening  wasoffered and accepted  3) ASCCP guidelines and rational discussed.  Pamela Mccormick opts for every 5 years screening interval  4) Osteoporosis   Consider FDA-approved medical therapies in postmenopausal women and men aged 23 years and older, based on the following: a) A hip or vertebral (clinical or morphometric) fracture b) T-score ? -2.5 at the femoral neck or spine  after appropriate evaluation to exclude secondary causes C) Low bone mass (T-score between -1.0 and -2.5 at the femoral neck or spine) and a 10-year probability of a hip fracture ? 3% or a 10-year probability of a major osteoporosis-related fracture ? 20% based on the [place]-adapted WHO algorithm   5) Routine healthcare maintenance including cholesterol, diabetes screening discussed  has wellness labs drawn through work.  6) Colonoscopy has been ordered.  Screening recommended starting at age 59 for average risk individuals, age 28 for individuals deemed at increased risk (including African Americans) and recommended to continue until age 37.  For Pamela Mccormick age 53-85 individualized approach is recommended.  Gold standard screening is via colonoscopy, Cologuard screening is an acceptable alternative for Pamela Mccormick unwilling or unable to undergo colonoscopy.  "Colorectal cancer screening for average?risk adults: 2018 guideline update from the American Cancer Society"[place]: A Cancer Journal for Clinicians: Feb 01, 2017   7) Anxiety: recommend starting lexapro or seeing a therapist. Encouraged pt to return to exercise routine as this can help with mood and improve her cholesterol and HA1C numbers.   8) Post menopause: encouraged pt to look into plant based estrogen creams that may be less expensive than Premarin,.    Carie Caddy, CNM   [facility] 05/11/2023, 1:15 PM

## 2023-05-12 ENCOUNTER — Telehealth: Payer: Self-pay

## 2023-05-12 ENCOUNTER — Other Ambulatory Visit: Payer: Self-pay

## 2023-05-12 DIAGNOSIS — Z1211 Encounter for screening for malignant neoplasm of colon: Secondary | ICD-10-CM

## 2023-05-12 LAB — HEP, RPR, HIV PANEL
HIV Screen 4th Generation wRfx: NONREACTIVE
Hepatitis B Surface Ag: NEGATIVE
RPR Ser Ql: NONREACTIVE

## 2023-05-12 LAB — CERVICOVAGINAL ANCILLARY ONLY
Chlamydia: NEGATIVE
Comment: NEGATIVE
Comment: NEGATIVE
Comment: NORMAL
Neisseria Gonorrhea: NEGATIVE
Trichomonas: NEGATIVE

## 2023-05-12 LAB — HEPATITIS C ANTIBODY: Hep C Virus Ab: NONREACTIVE

## 2023-05-12 MED ORDER — NA SULFATE-K SULFATE-MG SULF 17.5-3.13-1.6 GM/177ML PO SOLN
1.0000 | Freq: Once | ORAL | 0 refills | Status: AC
Start: 2023-05-12 — End: 2023-05-12

## 2023-05-12 NOTE — Telephone Encounter (Signed)
Gastroenterology Pre-Procedure Review  Request Date: 06/22/23 Requesting Physician: Dr. Allegra Lai  PATIENT REVIEW QUESTIONS: The patient responded to the following health history questions as indicated:    1. Are you having any GI issues? no 2. Do you have a personal history of Polyps? no 3. Do you have a family history of Colon Cancer or Polyps? yes (uncles colon polyps) 4. Diabetes Mellitus? no 5. Joint replacements in the past 12 months?no 6. Major health problems in the past 3 months?no 7. Any artificial heart valves, MVP, or defibrillator?no    MEDICATIONS & ALLERGIES:    Patient reports the following regarding taking any anticoagulation/antiplatelet therapy:   Plavix, Coumadin, Eliquis, Xarelto, Lovenox, Pradaxa, Brilinta, or Effient? no Aspirin? no  Patient confirms/reports the following medications:  Current Outpatient Medications  Medication Sig Dispense Refill   acyclovir (ZOVIRAX) 400 MG tablet   0   acyclovir (ZOVIRAX) 400 MG tablet take 1 tablet by mouth three times a day AS NEEDED FOR ONE WEEK FOR COLD SORES     cetirizine (ZYRTEC) 10 MG tablet Take by mouth.     cloNIDine (CATAPRES) 0.1 MG tablet Take 1 tablet (0.1 mg total) by mouth daily. (Patient not taking: Reported on 01/05/2022) 90 tablet 3   conjugated estrogens (PREMARIN) vaginal cream 0.5 grams per vagina twice a week 42.5 g 12   escitalopram (LEXAPRO) 10 MG tablet Take by mouth.     fluticasone (FLONASE) 50 MCG/ACT nasal spray Place into the nose.     No current facility-administered medications for this visit.    Patient confirms/reports the following allergies:  Allergies  Allergen Reactions   Amoxicillin Other (See Comments)    Causes really bad yeast infections    No orders of the defined types were placed in this encounter.   AUTHORIZATION INFORMATION Primary Insurance: 1D#: Group #:  Secondary Insurance: 1D#: Group #:  SCHEDULE INFORMATION: Date: 06/22/23 Time: Location: [facility]

## 2023-05-24 ENCOUNTER — Ambulatory Visit
Admission: RE | Admit: 2023-05-24 | Discharge: 2023-05-24 | Disposition: A | Discharge status: Home / Self Care | Payer: 59 | Source: Ambulatory Visit | Attending: Licensed Practical Nurse | Admitting: Licensed Practical Nurse

## 2023-05-24 ENCOUNTER — Ambulatory Visit: Payer: 59

## 2023-05-24 DIAGNOSIS — R928 Other abnormal and inconclusive findings on diagnostic imaging of breast: Secondary | ICD-10-CM

## 2023-06-21 ENCOUNTER — Telehealth: Payer: Self-pay

## 2023-06-21 NOTE — Telephone Encounter (Signed)
I see in the Depo of the Endo unit patient cancel her colonoscopy for 06/22/2023. Called patient and left a message for call back to find out if she is wanting to reschedule her colonoscopy,

## 2023-06-22 ENCOUNTER — Ambulatory Visit: Admission: RE | Admit: 2023-06-22 | Payer: 59 | Source: Home / Self Care | Admitting: Gastroenterology

## 2023-06-22 ENCOUNTER — Encounter: Admission: RE | Payer: Self-pay | Source: Home / Self Care

## 2023-06-22 SURGERY — COLONOSCOPY WITH PROPOFOL
Anesthesia: General

## 2024-06-28 ENCOUNTER — Encounter: Payer: Self-pay | Admitting: Licensed Practical Nurse

## 2024-06-28 ENCOUNTER — Ambulatory Visit: Admitting: Licensed Practical Nurse

## 2024-06-28 VITALS — BP 130/83 | HR 78 | Ht 63.0 in | Wt 225.0 lb

## 2024-06-28 DIAGNOSIS — Z1211 Encounter for screening for malignant neoplasm of colon: Secondary | ICD-10-CM

## 2024-06-28 DIAGNOSIS — Z01419 Encounter for gynecological examination (general) (routine) without abnormal findings: Secondary | ICD-10-CM

## 2024-06-28 DIAGNOSIS — Z1231 Encounter for screening mammogram for malignant neoplasm of breast: Secondary | ICD-10-CM

## 2024-06-28 NOTE — Progress Notes (Signed)
 Gynecology Annual Exam  PCP: Diedra Lame, MD  Chief Complaint:  Chief Complaint  Patient presents with   Gynecologic Exam    History of Present Illness:Patient is a 53 y.o. G1P1 presents for annual exam. The patient having a difficulty losing weight despite lifestyle  modifications. Has gained 15lbs since last visit. She has seen been seen at the bariatric clinic, goes to the gym 3-4 times per week and walks the other days. She was told she is prediabetic. She does not want to go on weight loss drugs, but might consider if there were a pill, does not want to be on any of injectables out concern for side effects and need to be on it for life.    LMP: Patient's last menstrual period was 03/04/2019. Is not taking Premarin  as it is expensive Has photoflashes occasionally Has vaginal dryness    The patient is occasionally  sexually active with 1 female partner, she is not bothered by the infrequency. She denies dyspareunia.  The patient does perform self breast exams.  There is no notable family history of breast or ovarian cancer in her family.  The patient wears seatbelts: yes.   The patient has regular exercise: yes.    The patient denies current symptoms of depression.   Anxiety has improved since last visit, she notices exercise helps,  hase PRN medication. 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  In addition there is a  superficial and  Deep perivascular and periadnexal inflammatory infiltrate composed of lymphocytes and histiocytes    Abnormal perimenopausal bleeding 07/14/2017   Screening for breast cancer 05/01/2017   Menopause 05/01/2017   Hip pain 06/20/2015   Abnormal mammogram     IMO SNOMED Dx Update Oct 2024    GERD (gastroesophageal reflux disease) 10/08/2012   Oral herpes 10/08/2012   Tobacco use 10/08/2012    Past Surgical History:  Past Surgical History:  Procedure Laterality Date   BREAST BIOPSY Left 05/2012   bx neg   BREAST SURGERY Left 2013   left breast core biopsy   COLONOSCOPY  AGE 51   skull fx   1984   FRACTURE   TONSILLECTOMY  AGE 59    Gynecologic History:  Patient's last menstrual period was 03/04/2019. Last Pap: Results were: 2023 no abnormalities  Last mammogram: 2024 Results were: BI-RAD I, has an area on her left breast that is always flagged suspicions it has not changed in years-testing is always negative.   Obstetric History: G1P1  Family History:  Family History  Problem Relation Age of Onset   Lymphoma Mother 69   Hypertension Mother    Diabetes Father    CAD Father    Hypertension Father    Hypertension Sister  Hypertension Brother    Breast cancer Neg Hx    Ovarian cancer Neg Hx     Social History:  Social History   Socioeconomic History   Marital status: Married    Spouse name: Not on file   Number of children: Not on file   Years of education: Not on file   Highest education level: Not on file  Occupational History   Not on file  Tobacco Use   Smoking status: Every Day    Current packs/day: 1.00    Average packs/day: 1 pack/day for 20.0 years (20.0 ttl pk-yrs)    Types: Cigarettes   Smokeless tobacco: Never  Substance and Sexual Activity   Alcohol use: Not Currently   Drug use: No   Sexual  activity: Yes    Birth control/protection: None  Other Topics Concern   Not on file  Social History Narrative   Not on file   Social Drivers of Health   Financial Resource Strain: Low Risk  (06/06/2024)   Received from [facility]   Overall Financial Resource Strain (CARDIA)    Difficulty of Paying Living Expenses: Not hard at all  Food Insecurity: No Food Insecurity (06/06/2024)   Received from [facility]   Hunger Vital Sign    Within the past 12 months, you worried that your food would run out before you got the money to buy more.: Never true    Within the past 12 months, the food you bought just didn't last and you didn't have money to get more.: Never true  Transportation Needs: No Transportation Needs (06/06/2024)   Received from [facility] - Transportation    In the past 12 months, has lack of transportation kept you from medical appointments or from getting medications?: No    Lack of Transportation (Non-Medical): No  Physical Activity: Not on file  Stress: Not on file  Social Connections: Not on file  Intimate Partner Violence: Not on file    Allergies:  Allergies  Allergen Reactions   Amoxicillin Other (See Comments)    Causes really bad yeast infections    Medications: Prior to Admission medications   Medication Sig Start Date End Date Taking? Authorizing Provider  fluticasone (FLONASE) 50 MCG/ACT nasal spray Place into the nose. 06/24/16 06/28/24 Yes [provider]  pantoprazole  (PROTONIX ) 40 MG tablet Take 40 mg by mouth. 06/06/24  Yes [provider]  acyclovir (ZOVIRAX) 400 MG tablet  02/13/17   [provider]  acyclovir (ZOVIRAX) 400 MG tablet take 1 tablet by mouth three times a day AS NEEDED FOR ONE WEEK FOR COLD SORES Patient not taking: Reported on 06/28/2024 08/23/16   [provider]  cetirizine (ZYRTEC) 10 MG tablet Take by mouth. Patient not taking:  Reported on 06/28/2024 06/24/16 06/24/17  [provider]  cloNIDine  (CATAPRES ) 0.1 MG tablet Take 1 tablet (0.1 mg total) by mouth daily. Patient not taking: Reported on 01/05/2022 12/08/20   Arloa Lamar SQUIBB, MD  conjugated estrogens  (PREMARIN ) vaginal cream 0.5 grams per vagina twice a week Patient not taking: Reported on 06/28/2024 01/05/22   Delinda Jinnie Jansky, CNM  escitalopram (LEXAPRO) 10 MG tablet Take by mouth. Patient not taking: Reported on 06/28/2024 04/25/19 07/24/19  [provider]    Physical Exam Vitals: Blood pressure 130/83, pulse 78, height 5' 3 (1.6 m), weight 225 lb (102.1 kg), last menstrual period 03/04/2019.  General: NAD HEENT: normocephalic, anicteric Thyroid: no enlargement,  no palpable nodules Pulmonary: No increased work of breathing, CTAB Cardiovascular: RRR, distal pulses 2+ Breast: Breast symmetrical, no tenderness, no palpable nodules or masses, no skin or nipple retraction present, no nipple discharge.  No axillary or supraclavicular lymphadenopathy. Abdomen: NABS, soft, non-tender, non-distended.  Umbilicus without lesions.  No hepatomegaly, splenomegaly or masses palpable. No evidence of hernia  Genitourinary:  External: Normal external female genitalia, have a thin line of redness along both labia at introitus-non tender, most likely related to menopause. .  Normal urethral meatus, normal Bartholin's and Skene's glands.    Vagina: Normal vaginal mucosa, no evidence of prolapse.    Cervix: Grossly normal in appearance, no bleeding  Uterus: Non-enlarged, mobile, normal contour.  No CMT  Adnexa: ovaries non-enlarged, no adnexal masses  Rectal: deferred  Lymphatic: no evidence of inguinal lymphadenopathy Extremities: no edema, erythema, or tenderness Neurologic: Grossly intact Psychiatric: mood appropriate, affect full   Assessment: 53 y.o. G1P1 routine annual exam  Plan: Problem List Items Addressed This Visit       Other    Screening for breast cancer   Relevant Orders   MM DIGITAL SCREENING BILATERAL   Other Visit Diagnoses       Well woman exam    -  Primary     Screening for colon cancer       Relevant Orders   Ambulatory referral to Gastroenterology       1) Mammogram - recommend yearly screening mammogram.  Mammogram Was ordered today  2) STI screening  wasoffered and declined  3) ASCCP guidelines and rational discussed.  Patient opts for every 5 years screening interval  4) Osteoporosis  - per USPTF routine screening DEXA at age 60   Consider FDA-approved medical therapies in postmenopausal women and men aged 77 years and older, based on the following: a) A hip or vertebral (clinical or morphometric) fracture b) T-score <= -2.5 at the femoral neck or spine after appropriate evaluation to exclude secondary causes C) Low bone mass (T-score between -1.0 and -2.5 at the femoral neck or spine) and a 10-year probability of a hip fracture >= 3% or a 10-year probability of a major osteoporosis-related fracture >= 20% based on the [place] -adapted WHO algorithm   5) Routine healthcare maintenance including cholesterol, diabetes screening discussed managed by PCP  6) Colonoscopy has been >19 years ordered today  Screening recommended starting at age 25 for average risk individuals, age 5 for individuals deemed at increased risk (including African Americans) and recommended to continue until age 32.  For patient age 39-85 individualized approach is recommended.  Gold standard screening is via colonoscopy, Cologuard screening is an acceptable alternative for patient unwilling or unable to undergo colonoscopy.  Colorectal cancer screening for average?risk adults: 2018 guideline update from the American Cancer SocietyCA: A Cancer Journal for Clinicians: Feb 01, 2017   7) Weight gain, rec looking into the [place] Diet. Consider returning to the bariatric clinic for further advice.    Jinnie Cookey, CNM   [facility] 06/28/2024, 5:37 PM
